# Patient Record
Sex: Male | Born: 1950
Health system: Southern US, Community
[De-identification: ages and names within clinical notes are randomized; demographics above are authoritative.]

## PROBLEM LIST (undated history)

## (undated) DIAGNOSIS — M5412 Radiculopathy, cervical region: Secondary | ICD-10-CM

## (undated) DIAGNOSIS — I10 Essential (primary) hypertension: Secondary | ICD-10-CM

## (undated) DIAGNOSIS — R0789 Other chest pain: Secondary | ICD-10-CM

## (undated) DIAGNOSIS — R42 Dizziness and giddiness: Secondary | ICD-10-CM

## (undated) DIAGNOSIS — Z981 Arthrodesis status: Secondary | ICD-10-CM

## (undated) DIAGNOSIS — E785 Hyperlipidemia, unspecified: Secondary | ICD-10-CM

## (undated) DIAGNOSIS — E039 Hypothyroidism, unspecified: Secondary | ICD-10-CM

## (undated) DIAGNOSIS — Z87438 Personal history of other diseases of male genital organs: Secondary | ICD-10-CM

## (undated) DIAGNOSIS — G459 Transient cerebral ischemic attack, unspecified: Secondary | ICD-10-CM

## (undated) DIAGNOSIS — N529 Male erectile dysfunction, unspecified: Secondary | ICD-10-CM

## (undated) DIAGNOSIS — R002 Palpitations: Secondary | ICD-10-CM

## (undated) DIAGNOSIS — R319 Hematuria, unspecified: Secondary | ICD-10-CM

## (undated) DIAGNOSIS — M1990 Unspecified osteoarthritis, unspecified site: Secondary | ICD-10-CM

## (undated) HISTORY — DX: Transient cerebral ischemic attack, unspecified: G45.9

## (undated) HISTORY — DX: Male erectile dysfunction, unspecified: N52.9

## (undated) HISTORY — DX: Other chest pain: R07.89

## (undated) HISTORY — DX: Radiculopathy, cervical region: M54.12

## (undated) HISTORY — PX: CYSTOSCOPY W/ URETEROSCOPY: SUR379

## (undated) HISTORY — DX: Dizziness and giddiness: R42

## (undated) HISTORY — DX: Palpitations: R00.2

## (undated) HISTORY — DX: Hyperlipidemia, unspecified: E78.5

---

## 2000-07-29 ENCOUNTER — Observation Stay (HOSPITAL_COMMUNITY): Admission: RE | Admit: 2000-07-29 | Discharge: 2000-07-30 | Payer: Self-pay | Admitting: Neurosurgery

## 2000-07-29 HISTORY — PX: ANTERIOR CERVICAL DECOMP/DISCECTOMY FUSION: SHX1161

## 2000-08-17 ENCOUNTER — Encounter: Admission: RE | Admit: 2000-08-17 | Discharge: 2000-08-17 | Payer: Self-pay | Admitting: Neurosurgery

## 2000-10-19 ENCOUNTER — Encounter: Admission: RE | Admit: 2000-10-19 | Discharge: 2000-10-19 | Payer: Self-pay | Admitting: Neurosurgery

## 2001-01-14 ENCOUNTER — Encounter: Admission: RE | Admit: 2001-01-14 | Discharge: 2001-01-14 | Payer: Self-pay | Admitting: Neurosurgery

## 2001-09-12 ENCOUNTER — Ambulatory Visit (HOSPITAL_COMMUNITY): Admission: RE | Admit: 2001-09-12 | Discharge: 2001-09-12 | Payer: Self-pay | Admitting: Internal Medicine

## 2001-09-26 ENCOUNTER — Encounter: Payer: Self-pay | Admitting: Family Medicine

## 2001-09-26 ENCOUNTER — Ambulatory Visit (HOSPITAL_COMMUNITY): Admission: RE | Admit: 2001-09-26 | Discharge: 2001-09-26 | Payer: Self-pay | Admitting: Family Medicine

## 2002-09-26 ENCOUNTER — Encounter: Payer: Self-pay | Admitting: Family Medicine

## 2002-09-26 ENCOUNTER — Ambulatory Visit (HOSPITAL_COMMUNITY): Admission: RE | Admit: 2002-09-26 | Discharge: 2002-09-26 | Payer: Self-pay | Admitting: Family Medicine

## 2003-06-30 DIAGNOSIS — R0789 Other chest pain: Secondary | ICD-10-CM

## 2003-06-30 DIAGNOSIS — R42 Dizziness and giddiness: Secondary | ICD-10-CM

## 2003-06-30 HISTORY — DX: Other chest pain: R07.89

## 2003-06-30 HISTORY — DX: Dizziness and giddiness: R42

## 2003-08-09 ENCOUNTER — Ambulatory Visit (HOSPITAL_COMMUNITY): Admission: RE | Admit: 2003-08-09 | Discharge: 2003-08-09 | Payer: Self-pay | Admitting: Family Medicine

## 2003-09-28 ENCOUNTER — Ambulatory Visit (HOSPITAL_COMMUNITY): Admission: RE | Admit: 2003-09-28 | Discharge: 2003-09-28 | Payer: Self-pay | Admitting: Family Medicine

## 2003-12-12 ENCOUNTER — Emergency Department (HOSPITAL_COMMUNITY): Admission: EM | Admit: 2003-12-12 | Discharge: 2003-12-12 | Payer: Self-pay | Admitting: Family Medicine

## 2004-10-14 ENCOUNTER — Ambulatory Visit (HOSPITAL_COMMUNITY): Admission: RE | Admit: 2004-10-14 | Discharge: 2004-10-14 | Payer: Self-pay | Admitting: Family Medicine

## 2004-12-15 ENCOUNTER — Ambulatory Visit (HOSPITAL_COMMUNITY): Admission: RE | Admit: 2004-12-15 | Discharge: 2004-12-15 | Payer: Self-pay | Admitting: Internal Medicine

## 2004-12-15 ENCOUNTER — Ambulatory Visit: Payer: Self-pay | Admitting: Internal Medicine

## 2005-10-16 ENCOUNTER — Ambulatory Visit (HOSPITAL_COMMUNITY): Admission: RE | Admit: 2005-10-16 | Discharge: 2005-10-16 | Payer: Self-pay | Admitting: Family Medicine

## 2007-11-08 ENCOUNTER — Ambulatory Visit (HOSPITAL_COMMUNITY): Admission: RE | Admit: 2007-11-08 | Discharge: 2007-11-08 | Payer: Self-pay | Admitting: Family Medicine

## 2009-02-27 ENCOUNTER — Emergency Department (HOSPITAL_COMMUNITY): Admission: EM | Admit: 2009-02-27 | Discharge: 2009-02-27 | Payer: Self-pay | Admitting: Emergency Medicine

## 2010-11-14 NOTE — H&P (Signed)
[place]. [facility]  Patient:    Derrick [name], Derrick [name]                         MRN: [id number] Adm. Date:  [id number] Attending:  Tressie Stalker D                         History and Physical  CHIEF COMPLAINT:  Neck pain and right shoulder and arm pain.  HISTORY OF PRESENT ILLNESS:  The patient is a 60 year old white male who was having some trouble with his neck and interscapular pain for about a year. Things got significantly worse the beginning of January 2002 when he began having more pain in his neck radiating to his right shoulder.  He was worked up as an outpatient with a cervical MRI; it demonstrated a large herniated disk at C4-5 as well as significant spondylosis at C5-6 and C6-7.  He was kindly sent for my consultation. I discussed the various treatment options with him including surgery.  The patient weighed the risks, benefits and alternatives to surgery and decided to proceed with a three-level anterior cervical diskectomy, fusion and plating.  PAST MEDICAL HISTORY:  Positive for hypertension and a right retinal hemorrhage.  PREVIOUS SURGICAL HISTORY:  Right eye surgery.  MEDICATIONS PRIOR TO ADMISSION:  Bisoprolol one p.o. q.d., prednisone taper,, Lorcet p.r.n. for pain.  ALLERGIES:  He has no known drug allergies.  FAMILY MEDICAL HISTORY:  Both the patients parents died at approximately age 40.  His mother had a myocardial infarction, his father a cerebrovascular accident.  SOCIAL HISTORY:  The patient is married.  He has two children.  He lives in [place].  He denies tobacco, ethanol or drug use.  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  REVIEW OF SYSTEMS:  Negative except as above.  PHYSICAL EXAMINATION  GENERAL:  A pleasant, well-nourished, well-developed 60 year old white male complaining of neck pain and right shoulder and arm pain.  Height 5 feet 10 inches.  Weight 198 pounds.  HEENT:  Normocephalic, atraumatic.   Pupils are equal, round and reactive to light.  Extraocular muscles are intact.  Sclerae white.  Conjunctivae pink. Oropharynx benign.  Uvula midline.  NECK:  Supple.  There are no masses, deformities, tracheal deviation, jugular venous distention or carotid bruits.  He has a limited cervical range of motion, particularly with rotation to the right.  Spurlings test is positive on the right and negative on the left.  Lhermittes sign was not present.  CHEST:  Thorax is symmetric.  Lungs clear to auscultation.  HEART:  Regular rate and rhythm.  ABDOMEN:  Soft and nontender.  EXTREMITIES:  No deformities.  BACK:  Exam is normal.  NEUROLOGIC:  The patient is alert and oriented x 3.  Cranial nerves II-XII are grossly intact bilaterally.  Vision and hearing are grossly normal bilaterally.  Motor strength is 5/5 in bilateral deltoids, biceps, triceps, hand grips, wrist extensors, interossei, psoas, quadriceps, gastrocnemii, extensor halluces longi.  Cerebellar exam is intact to rapid alternating movements of the upper extremities bilaterally.  Sensory exam is normal to light touch and pinprick sensation in all tested dermatomes bilaterally.  Deep tendon reflexes are 1/4 in his bilateral biceps and triceps, trace in his bilateral brachioradialis, 1-2/4 in his bilateral quadriceps and gastrocnemii. He has bilateral flexor plantar reflexes.  He has four-beat nonsustained ankle clonus on the left; no ankle clonus on the right.  IMAGING STUDIES:  Patient had a cervical MRI performed at [facility] on July 09, 2000 which demonstrates a normal cervical lordosis.  He has a large herniated disk at C4-5 on the right and small disk herniations/spondylosis at C5-6 and C6-7 with neural foraminal stenosis.  ASSESSMENT AND PLAN:  C4-5 herniated nucleus pulposus, C5-6 and C6-7 herniated nucleus pulposus, degenerative disease, spondylosis and spinal stenosis.  I discussed the situation with  the patient and reviewed his MRI scan with him and his wife and pointed out the abnormalities.  Clinically, his symptoms are most consistent with a right C5 or possibly C6 radiculopathy; however, he had significant neural compression at three levels, i.e., C4-5, C5-6 and C6-7.  I have discussed the various treatment options with him including doing nothing, continuing medical management and surgery.  I have discussed both anterior and posterior surgery.  I have described C4-5, C5-6 and C6-7 anterior cervical diskectomies and interbody iliac crest allograft arthrodesis with anterior cervical plating.  I have shown him surgical models and discussed the risks of surgery extensively.  The patient has weighed the risks, benefits and alternatives to surgery and has decided to proceed with the operation on July 29, 2000. DD:  07/29/00 TD:  07/30/00 Job: [id number]

## 2010-11-14 NOTE — Op Note (Signed)
NAMEJACOBI, Derrick [name]                  ACCOUNT NO.:  [id number]   MEDICAL RECORD NO.:  [id number]          PATIENT TYPE:  AMB   LOCATION:  DAY                           FACILITY:  [facility]   PHYSICIAN:  Lionel December, M.D.    DATE OF BIRTH:  01/04/1951   DATE OF PROCEDURE:  12/15/2004  DATE OF DISCHARGE:                                 OPERATIVE REPORT   PROCEDURE:  Colonoscopy.   INDICATIONS:  Derrick [name] is a 60 year old Caucasian male who had a colonoscopy by  Dr. Lovell Sheehan in [phone] with removal of one polyp, but this apparently was  loss because biopsy report indicated there was vegetable material. I did  look at the pictures from the procedure, and it showed what looked like a  polyp. He is here for surveillance exam. Family history is negative for  colorectal carcinoma.   Procedure risks were reviewed with the patient, and informed consent was  obtained.   PREMEDICATION:  Demerol 50 mg IV, Versed 4 mg IV in divided dose.   FINDINGS:  Procedure performed in endoscopy suite. The patient's vital signs  and O2 saturation were monitored during the procedure and remained stable.  The patient was placed in left lateral position and rectal examination  performed. No abnormality noted on external or digital exam. Olympus  videoscope was placed in the rectum and advanced under vision into sigmoid  colon beyond. Preparation was satisfactory. Scope was passed to the cecum  which was identified by appendiceal orifice and ileocecal valve. Pictures  taken for the record. As the scope was withdrawn, colonic mucosa was  carefully examined. A single polyp at hepatic flexure which was easily  ablated via cold biopsy. Mucosa of the rest of the colon was normal. Rectal  mucosa similarly was normal. Scope was retroflexed to examine anorectal  junction, and hemorrhoids were noted below and above the dentate line.  Endoscope was straightened and withdrawn. The patient tolerated the  procedure well.   FINAL  DIAGNOSES:  1.  Single small polyp at hepatic flexure which was ablated via cold biopsy.  2.  Internal/external hemorrhoids.   RECOMMENDATIONS:  1.  He will resume his usual medications.  2.  High-fiber diet.  3.  Contact the patient with biopsy results. Given today's findings, he can      wait five years until his next colonoscopy.       NR/MEDQ  D:  12/15/2004  T:  12/15/2004  Job:  [id number]   cc:   Kirk Ruths, M.D.  [address]  [place]  [place] [address]  Fax: [phone]

## 2010-11-14 NOTE — Discharge Summary (Signed)
[place]. [facility]  Patient:    Derrick [name], Derrick [name]                         MRN: [id number] Adm. Date:  [id number] Disc. Date: [id number] Attending:  Tressie Stalker D                           Discharge Summary  HISTORY OF PRESENT ILLNESS:  For the full details of this admission, please refer to the typed history and physical.  The patient is a 60 year old white male who has suffered from neck and right arm pain.  He failed medical management and was worked out as an outpatient with cervical MRI which demonstrated herniated disk at C4-5, C5-6 and C6-7. The patient therefore weighed the risks, benefits, alternatives of surgery to proceed with the operation.  For past medical history, past surgical history and medications prior to admission, drug allergies, family medical history, social history, admission physical exam, imaging studies, assessment, etc., please refer to typed history and physical.  HOSPITAL COURSE:  I performed a C4-5, C5-6 and C6-7 extensive anterior cervical diskectomy with allograft arthrodesis and anterior subplating (Codmans) on July 29, 2000, without complications.  (For full details of this of this operation, please refer to type operative note).  Postoperative course:  The patients postoperative course was unremarkable. By postoperative day #1, he was eating well, ambulating well.  His wound was healing well without signs of infection and he had normal motor strength in all four extremities.  He was requesting discharge to home.  The patient did have an episode of urinary retention and required in and out catheterization but this resolved by July 30, 2000.  The patient was therefore discharged to home on July 30, 2000.  DISCHARGE MEDICATIONS: 1. Resume his outpatient medical regimen. 2. I gave him a prescription for Valium 5 mg #40 one p.o. q.6h. p.r.n. for    muscle spasms. 3. Tylox #60 one or two p.o. q.4h. p.r.n. for  pain, limit of eight per day,    no refills.  DISCHARGE INSTRUCTIONS:  The patient was given written discharge instructions and instructed to follow up with me in four weeks.  DISCHARGE DIAGNOSES: 1. C4-5, C5-6 and C6-7 herniated nucleus pulposus, spondylosis,    degenerative disease, spinal stenosis, cervicalgia and cervical    radiculopathy. 2. Urinary retention.  PROCEDURE PERFORMED:  C4-5, C5-6 and C6-7 extensive anterior cervical diskectomy, interbody iliac crest allograft arthrodesis, anterior cervical plating C4-C7 with Codman plate. DD:  07/30/00 TD:  08/01/00 Job: [id number]

## 2010-11-14 NOTE — Op Note (Signed)
[place]. [facility]  Patient:    Derrick [name], Derrick [name]                         MRN: [id number] Proc. Date: 07/29/00 Adm. Date:  [id number] Attending:  Tressie Stalker D                           Operative Report  PREOPERATIVE DIAGNOSES:  C4-5 herniated nucleus pulposus, C5-6, C6-7, degenerative spondylosis, spinal stenosis, cervicalgia, cervical radiculopathy.  POSTOPERATIVE DIAGNOSES:  C4-5 herniated nucleus pulposus, C5-6, C6-7, degenerative spondylosis, spinal stenosis, cervicalgia, cervical radiculopathy.  PROCEDURES:  C4-5, C5-6, C6-7 extensive anterior cervical diskectomy, interbody iliac crest allograft arthrodesis, anterior cervical plating, C4 to C7, using Codman titanium cervical plate and screws.  SURGEON:  Cristi Loron, M.D.  ASSISTANT:  Tanya Nones. Jeral Fruit, M.D.  ANESTHESIA:  General endotracheal.  ESTIMATED BLOOD LOSS:  250 cc.  SPECIMENS:  None.  DRAINS:  None.  COMPLICATIONS:  None.  BRIEF HISTORY:  The patient is a 60 year old white male who has suffered from right shoulder and arm pain.  He failed medical management as an outpatient and was worked up with a cervical MRI that demonstrated a herniated disk at C4-5, C5-6, and C6-7.  The patient therefore weighed the risks, benefits, and alternatives of surgery and decided to proceed with anterior cervical diskectomy and fusion and fusion and plating.  DESCRIPTION OF PROCEDURE:  The patient was brought to the operating room by the anesthesia team.  General endotracheal anesthesia was induced.  The patient remained in the supine position.  A roll was placed under his shoulders to place his neck in slight extension.  The anterior cervical region was then prepared with Betadine scrub and Betadine solution, sterile drapes were applied, and I injected the area to be incised with Marcaine with epinephrine solution.  I used a scalpel to make an incision in the patients left neck along the  anterior border of the sternocleidomastoid muscle.  I used the Metzenbaum scissors to dissect out to the platysma muscle and divided it along the direction of the skin incision.  I dissected medial to the sternocleidomastoid muscle, jugular vein, carotid artery, and then bluntly dissected down to the anterior cervical spine.  I therefore identified the esophagus and retracted it medially.  I cleared the soft tissue from the anterior cervical spine with the Kitner swabs and then inserted a bent spinal needle into the upper exposed interspace.  I obtained the intraoperative radiograph that demonstrated that the needle was at C3-4.  I then turned my attention to the lower disk space.  I used the electrocautery to detach the medial border of the longus colli muscle from off the C4-5, C5-6, and C6-7 intervertebral disk space.  I inserted the Caspar self-retaining retractor for exposure, and I began a decompression at C4-5.  I brought the operative microscope into the field.  Under some magnification and illumination, I completed the decompression.  I used the 15 blade scalpel to incise the C5-6 intervertebral disk.  I performed a partial diskectomy with the pituitary forceps and Carlin curettes and inserted distraction screws at C4, C5, and distracted the interspace.  I drilled away the remainder of the intervertebral disk and thinned out the posterior longitudinal ligament.  There was a large herniated disk just off to the right.  I removed it in one large fragment using a pituitary forceps.  I removed the  remainder of the posterior longitudinal ligament with the Kerrison punch, undercutting the vertebral end plates at X9-1, decompressing the thecal sac, and then performed foraminotomies about the bilateral C5 nerve roots.  I then repeated the procedure at C5-6, i.e., I removed the distraction screw from C4, placing it into C6, distracting the interspace, incising the disk.  I performed a  partial diskectomy with the pituitary forceps and Carlin curettes, drilled away the remainder of the intervertebral disk at C5-6, decorticating the vertebral end plates, thinning out the posterior longitudinal ligament.  There were large bone spurs at this level.  I drilled them away with the drill.  I removed the remainder of the posterior longitudinal ligament with the Kerrison punch, undercutting the vertebral end plates at Y7-8, decompressing the thecal sac. I then performed foraminotomies about the bilateral C6 nerve roots, decompressing the nerve roots.  I then repeated this procedure at C6-7, removing the distraction screw from C5, placing it into C7, incising the intervertebral disk, performing a partial diskectomy with the pituitary forceps and the Carlin curettes.  I drilled away the remainder of the intervertebral disk with the high-speed drill, decorticating the vertebral end plates at G9-5, thinned out the posterior longitudinal ligament.  I drilled away some bone spurs from the vertebral end plates posteriorly, and then I removed the remainder of the posterior longitudinal ligament with the Kerrison punch, undercutting the vertebral end plates at C6 and C7.  I performed a foraminotomy of the bilateral C7 nerve roots, decompressing the nerve roots. At this point, I had a good decompression of the C4-5, C5-6, and C6-7 thecal sac, and the bilateral C5, C6, and C7 nerve roots.  I now turned my attention to the arthrodesis, obtained iliac crest tricortical allograft bone graft.  I fashioned it to these approximate dimensions:  7 mm in height and 1 cm in depth.  I placed one bone graft into the distracted C6-7 interspace, removed the distraction screw from C7, placed it in C5, distracted the C5-6 interspace, put another bone graft into C5-6 interspace, and then removed the distraction screw from C6 and placed it into C4, distracting C4-5, placed another allograft into C4-5  distracted interspace.  I then removed the distraction screws.  There was a good, snug fit of the bone graft at each level.  Having completed the arthrodesis, I now turned my attention to anterior spinal  instrumentation.  I used the high-speed drill to remove some anterior bone spurs from the anterior aspect of the C4, C5, C6, and C7 vertebral bodies.  I obtained the appropriate length Codman anterior cervical plate, laid it along the anterior aspect of the vertebral bodies from C4 to C7.  I put two holes at C4, two at C5, two at C6, and two at C7, tapped these holes, and secured the plate to the vertebral bodies with two 15 mm screws at each vertebra, and then obtained the intraoperative radiograph.  It demonstrated good position of the plate and screws (although I did not see the bottom screws because of the patients large shoulders, but they looked good in vivo).  I then secured these screws to the plate using the cam tightener at each screw.  I then copiously irrigated the wound with bacitracin solution, removed the solution. I achieved stringent hemostasis with bipolar electrocautery and Gelfoam and then removed the McCullough retractor and inspected the patients esophagus for any damage.  There was none.  I then reapproximated the patients platysma muscle with interrupted 3-0 Vicryl  suture, the subcutaneous tissue with interrupted 3-0 Vicryl suture, and the skin with Steri-Strips and benzoin. The wound was then coated with bacitracin ointment, a sterile dressing applied, and the drapes were removed.  The patient was subsequently extubated by the anesthesia team and transported to the postanesthesia care unit in stable condition.  All sponge, instrument, and needle counts were correct at the end of this case.  interrupted 3-0 Vicryl suture in the DD:  07/29/00 TD:  07/30/00 Job: [id number]

## 2011-09-24 ENCOUNTER — Other Ambulatory Visit (HOSPITAL_COMMUNITY): Payer: Self-pay | Admitting: Endocrinology

## 2011-09-24 DIAGNOSIS — N63 Unspecified lump in unspecified breast: Secondary | ICD-10-CM

## 2011-09-29 ENCOUNTER — Ambulatory Visit
Admission: RE | Admit: 2011-09-29 | Discharge: 2011-09-29 | Disposition: A | Discharge status: Home / Self Care | Payer: 59 | Source: Ambulatory Visit | Attending: Endocrinology | Admitting: Endocrinology

## 2011-09-29 DIAGNOSIS — N63 Unspecified lump in unspecified breast: Secondary | ICD-10-CM

## 2011-12-01 ENCOUNTER — Other Ambulatory Visit: Payer: Self-pay | Admitting: Urology

## 2011-12-18 ENCOUNTER — Encounter (HOSPITAL_BASED_OUTPATIENT_CLINIC_OR_DEPARTMENT_OTHER): Payer: Self-pay | Admitting: *Deleted

## 2011-12-18 NOTE — Progress Notes (Signed)
NPO AFTER MN. ARRIVES AT 1015. NEEDS ISTAT. CURRENT NOTE, EKG, STRESS TEST TO BE FAXED FROM [facility] [phone].  WILL TAKE SYNTHROID AM OF SURG W/ SIP OF WATER.

## 2011-12-24 NOTE — H&P (Signed)
Urology History and Physical Exam  CC: Blood in urine  HPI: 60 year old male presents for anesthetic cystoscopy, bilateral retrogrades for further evaluation of a thickened bladder and mild dilatation of his distal ureters seen on hematuria CT.   His urologic history is as follows:   ELEVATED PSA   He has had a prostate biopsy performed in November 2007. At that time, glandular size was about 40 cc and PSA was 3.45 . He had one focus of atypical acinar cells on the right side. His PSA since that time has been stable. His last PSA levels in December 2010 and May 2011, were both the same at 4.07. His PSA in September 2011 was 5.06. On 09/11/2010 it was 4.76. His PSA was checked again in June, 2012 was down to 2.86.  He was having decreased sensation with ejaculation. That has improved.  INTERVAL HISTORY: Derrick [name] was seen about a month ago by Dr. Regino Schultze for bilateral lower back pain, moving more to the left side recently. This has had some anterior radiation. It is "sore" and is not associated with gross hematuria. He was found to have microscopic hematuria, however and sent to [place] for further evaluation. He denies any change in lower urinary tract pattern. He has had no dysuria. He has had no nausea or vomiting. He denies any prior history of kidney stones   PMH: Past Medical History  Diagnosis Date  . Hematuria   . Arthritis NECK  . S/P cervical spinal fusion   . Hypertension CARDIOLOGIST- [facility]    STRESS TEST YRS AGO-- OK  ; LAST VISIT 1 MONTH AGO  . Hypothyroidism   . History of prostatitis     PSH: Past Surgical History  Procedure Date  . Anterior cervical decomp/discectomy fusion 07-29-2000    C4 - C7  . Cystoscopy w/ ureteroscopy 20 YRS AGO  (APPROX. 1990'S)    Allergies: No Known Allergies  Medications: No prescriptions prior to admission     Social History: History   Social History  . Marital Status: Married    Spouse Name: N/A    Number of  Children: N/A  . Years of Education: N/A   Occupational History  . Not on file.   Social History Main Topics  . Smoking status: Never Smoker   . Smokeless tobacco: Never Used  . Alcohol Use: No  . Drug Use: No  . Sexually Active:    Other Topics Concern  . Not on file   Social History Narrative  . No narrative on file    Family History: History reviewed. No pertinent family history.  Review of Systems: Positive: Back pain Negative:   A further 10 point review of systems was negative except what is listed in the HPI.  Physical Exam: @VITALS2 @ General: No acute distress.  Awake. Head:  Normocephalic.  Atraumatic. ENT:  EOMI.  Mucous membranes moist Neck:  Supple.  No lymphadenopathy. CV:  S1 present. S2 present. Regular rate. Pulmonary: Equal effort bilaterally.  Clear to auscultation bilaterally. Abdomen: Soft.  Non tender to palpation. Skin:  Normal turgor.  No visible rash. Extremity: No gross deformity of bilateral upper extremities.  No gross deformity of    bilateral lower extremities. Neurologic: Alert. Appropriate mood.    Studies:  No results found for this basename: HGB:2,WBC:2,PLT:2 in the last 72 hours  No results found for this basename: NA:2,K:2,CL:2,CO2:2,BUN:2,CREATININE:2,CALCIUM:2,MAGNESIUM:2,GFRNONAA:2,GFRAA:2 in the last 72 hours   No results found for this basename: PT:2,INR:2,APTT:2 in the last 72  hours   No components found with this basename: ABG:2    Assessment:  Microscopic hematuria   Plan: Anesthetic cystoscopy, bilateral retrograde pyelograms, possible ureteroscopy

## 2011-12-25 ENCOUNTER — Encounter (HOSPITAL_BASED_OUTPATIENT_CLINIC_OR_DEPARTMENT_OTHER)
Admission: RE | Disposition: A | Discharge status: Home / Self Care | Payer: Self-pay | Source: Ambulatory Visit | Attending: Urology

## 2011-12-25 ENCOUNTER — Ambulatory Visit (HOSPITAL_BASED_OUTPATIENT_CLINIC_OR_DEPARTMENT_OTHER)
Admission: RE | Admit: 2011-12-25 | Discharge: 2011-12-25 | Disposition: A | Discharge status: Home / Self Care | Payer: 59 | Source: Ambulatory Visit | Attending: Urology | Admitting: Urology

## 2011-12-25 ENCOUNTER — Encounter (HOSPITAL_BASED_OUTPATIENT_CLINIC_OR_DEPARTMENT_OTHER): Payer: Self-pay | Admitting: Anesthesiology

## 2011-12-25 ENCOUNTER — Encounter (HOSPITAL_BASED_OUTPATIENT_CLINIC_OR_DEPARTMENT_OTHER): Payer: Self-pay | Admitting: *Deleted

## 2011-12-25 ENCOUNTER — Ambulatory Visit (HOSPITAL_BASED_OUTPATIENT_CLINIC_OR_DEPARTMENT_OTHER): Payer: 59 | Admitting: Anesthesiology

## 2011-12-25 DIAGNOSIS — R3129 Other microscopic hematuria: Secondary | ICD-10-CM | POA: Insufficient documentation

## 2011-12-25 DIAGNOSIS — N2889 Other specified disorders of kidney and ureter: Secondary | ICD-10-CM | POA: Insufficient documentation

## 2011-12-25 DIAGNOSIS — I10 Essential (primary) hypertension: Secondary | ICD-10-CM | POA: Insufficient documentation

## 2011-12-25 DIAGNOSIS — E039 Hypothyroidism, unspecified: Secondary | ICD-10-CM | POA: Insufficient documentation

## 2011-12-25 HISTORY — DX: Arthrodesis status: Z98.1

## 2011-12-25 HISTORY — DX: Personal history of other diseases of male genital organs: Z87.438

## 2011-12-25 HISTORY — DX: Hematuria, unspecified: R31.9

## 2011-12-25 HISTORY — DX: Essential (primary) hypertension: I10

## 2011-12-25 HISTORY — PX: CYSTOSCOPY W/ RETROGRADES: SHX1426

## 2011-12-25 HISTORY — DX: Unspecified osteoarthritis, unspecified site: M19.90

## 2011-12-25 HISTORY — DX: Hypothyroidism, unspecified: E03.9

## 2011-12-25 LAB — POCT I-STAT 4, (NA,K, GLUC, HGB,HCT)
Hemoglobin: 15.3 g/dL (ref 13.0–17.0)
Sodium: 144 mEq/L (ref 135–145)

## 2011-12-25 SURGERY — CYSTOSCOPY, WITH RETROGRADE PYELOGRAM
Anesthesia: General | Site: Ureter | Laterality: Bilateral | Wound class: Clean Contaminated

## 2011-12-25 MED ORDER — IOHEXOL 350 MG/ML SOLN
INTRAVENOUS | Status: DC | PRN
  Administered 2011-12-25: 50 mL

## 2011-12-25 MED ORDER — ONDANSETRON HCL 4 MG/2ML IJ SOLN: 4.0000 mg | Freq: Four times a day (QID) | INTRAMUSCULAR | Status: DC | PRN

## 2011-12-25 MED ORDER — ACETAMINOPHEN 325 MG PO TABS: 650.0000 mg | ORAL_TABLET | ORAL | Status: DC | PRN

## 2011-12-25 MED ORDER — PROPOFOL 10 MG/ML IV EMUL
INTRAVENOUS | Status: DC | PRN
  Administered 2011-12-25: 200 mg via INTRAVENOUS

## 2011-12-25 MED ORDER — LACTATED RINGERS IV SOLN
INTRAVENOUS | Status: DC
  Administered 2011-12-25: 100 mL/h via INTRAVENOUS

## 2011-12-25 MED ORDER — MIDAZOLAM HCL 5 MG/5ML IJ SOLN
INTRAMUSCULAR | Status: DC | PRN
  Administered 2011-12-25: 1 mg via INTRAVENOUS

## 2011-12-25 MED ORDER — KETOROLAC TROMETHAMINE 30 MG/ML IJ SOLN
INTRAMUSCULAR | Status: DC | PRN
  Administered 2011-12-25: 30 mg via INTRAVENOUS

## 2011-12-25 MED ORDER — HYDROCODONE-ACETAMINOPHEN 5-500 MG PO CAPS: 1.0000 | ORAL_CAPSULE | ORAL | Status: AC | PRN

## 2011-12-25 MED ORDER — PROMETHAZINE HCL 25 MG/ML IJ SOLN: 6.2500 mg | INTRAMUSCULAR | Status: DC | PRN

## 2011-12-25 MED ORDER — BELLADONNA ALKALOIDS-OPIUM 16.2-60 MG RE SUPP
RECTAL | Status: DC | PRN
  Administered 2011-12-25: 1 via RECTAL

## 2011-12-25 MED ORDER — CEFAZOLIN SODIUM 1-5 GM-% IV SOLN: 1.0000 g | INTRAVENOUS | Status: DC

## 2011-12-25 MED ORDER — ACETAMINOPHEN 650 MG RE SUPP: 650.0000 mg | RECTAL | Status: DC | PRN

## 2011-12-25 MED ORDER — FENTANYL CITRATE 0.05 MG/ML IJ SOLN
INTRAMUSCULAR | Status: DC | PRN
  Administered 2011-12-25: 50 ug via INTRAVENOUS

## 2011-12-25 MED ORDER — SODIUM CHLORIDE 0.9 % IJ SOLN: 3.0000 mL | Freq: Two times a day (BID) | INTRAMUSCULAR | Status: DC

## 2011-12-25 MED ORDER — DEXAMETHASONE SODIUM PHOSPHATE 4 MG/ML IJ SOLN
INTRAMUSCULAR | Status: DC | PRN
  Administered 2011-12-25: 10 mg via INTRAVENOUS

## 2011-12-25 MED ORDER — CEFAZOLIN SODIUM-DEXTROSE 2-3 GM-% IV SOLR
2.0000 g | INTRAVENOUS | Status: AC
  Administered 2011-12-25: 2 g via INTRAVENOUS

## 2011-12-25 MED ORDER — OXYCODONE HCL 5 MG PO TABS: 5.0000 mg | ORAL_TABLET | ORAL | Status: DC | PRN

## 2011-12-25 MED ORDER — FENTANYL CITRATE 0.05 MG/ML IJ SOLN: 25.0000 ug | INTRAMUSCULAR | Status: DC | PRN

## 2011-12-25 MED ORDER — SODIUM CHLORIDE 0.9 % IV SOLN: 250.0000 mL | INTRAVENOUS | Status: DC | PRN

## 2011-12-25 MED ORDER — SULFAMETHOXAZOLE-TRIMETHOPRIM 800-160 MG PO TABS: 1.0000 | ORAL_TABLET | Freq: Two times a day (BID) | ORAL | Status: AC

## 2011-12-25 MED ORDER — MORPHINE SULFATE 2 MG/ML IJ SOLN: 2.0000 mg | INTRAMUSCULAR | Status: DC | PRN

## 2011-12-25 MED ORDER — KETOROLAC TROMETHAMINE 30 MG/ML IJ SOLN: 15.0000 mg | Freq: Once | INTRAMUSCULAR | Status: DC | PRN

## 2011-12-25 MED ORDER — SODIUM CHLORIDE 0.9 % IJ SOLN: 3.0000 mL | INTRAMUSCULAR | Status: DC | PRN

## 2011-12-25 MED ORDER — ONDANSETRON HCL 4 MG/2ML IJ SOLN
INTRAMUSCULAR | Status: DC | PRN
  Administered 2011-12-25: 4 mg via INTRAVENOUS

## 2011-12-25 MED ORDER — LIDOCAINE HCL (CARDIAC) 20 MG/ML IV SOLN
INTRAVENOUS | Status: DC | PRN
  Administered 2011-12-25: 60 mg via INTRAVENOUS

## 2011-12-25 SURGICAL SUPPLY — 18 items
ADAPTER CATH URET PLST 4-6FR (CATHETERS) ×2 IMPLANT
ADPR CATH URET STRL DISP 4-6FR (CATHETERS) ×2
BAG DRAIN URO-CYSTO SKYTR STRL (DRAIN) ×3 IMPLANT
BAG DRN UROCATH (DRAIN) ×2
CANISTER SUCT LVC 12 LTR MEDI- (MISCELLANEOUS) ×2 IMPLANT
CATH INTERMIT  6FR 70CM (CATHETERS) ×2 IMPLANT
CLOTH BEACON ORANGE TIMEOUT ST (SAFETY) ×3 IMPLANT
DRAPE CAMERA CLOSED 9X96 (DRAPES) ×3 IMPLANT
GLOVE BIO SURGEON STRL SZ8 (GLOVE) ×3 IMPLANT
GLOVE INDICATOR 6.5 STRL GRN (GLOVE) ×2 IMPLANT
GOWN STRL REIN XL XLG (GOWN DISPOSABLE) ×3 IMPLANT
GOWN XL W/COTTON TOWEL STD (GOWNS) ×3 IMPLANT
GUIDEWIRE 0.038 PTFE COATED (WIRE) IMPLANT
GUIDEWIRE ANG ZIPWIRE 038X150 (WIRE) IMPLANT
GUIDEWIRE STR DUAL SENSOR (WIRE) IMPLANT
IV NS IRRIG 3000ML ARTHROMATIC (IV SOLUTION) ×4 IMPLANT
NS IRRIG 500ML POUR BTL (IV SOLUTION) IMPLANT
PACK CYSTOSCOPY (CUSTOM PROCEDURE TRAY) ×3 IMPLANT

## 2011-12-25 NOTE — Op Note (Signed)
Preoperative diagnosis: Hematuria with bilateral ureteral dilatation Postoperative diagnosis: Same  Procedure: Cystoscopy, bilateral retrograde ureteropyelograms, interpretive fluoroscopy   Surgeon: Bertram Millard. Yannely Kintzel, M.D.  Anesthesia: Gen.  Indications: 61 year old male who was seen recently for microscopic hematuria and flank pain. CT revealed no evident ureteral stones, no nephrolithiasis, but the patient did have bilateral lower ureteral dilatation and a thickwalled bladder. He presents at this time for cystoscopy and retrograde pyelograms bilaterally, with possible ureteroscopy. Risks and complications have been discussed with the patient. These include but are not limited to worsened hematuria, infection a ureteral obstruction/injury. He understands these and desires to proceed.     Technique and findings: The patient was properly identified in the holding area and received preoperative IV antibiotics previous taken the operating room where general anesthetic was administered with the LMA. He is placed in the dorsolithotomy position. Genitalia and perineum were prepped and draped. Proper timeout was then performed.  A 22 French panendoscope was then passed under direct vision through his urethra. Urethra was free of lesions, prostate was nonobstructive. Bladder was entered and inspected circumferentially. There were 1-2+ trabeculations scattered throughout the bladder. There were no other mucosal abnormalities. There were no stones or foreign bodies. Ureteral orifices were normal in configuration and location. Still photographs of the bladder were taken.  At this point, bilateral retrograde ureteropyelograms were performed. I used a sensor-tip guidewire to assist with navigation of the ureteral orifice his, which was somewhat difficult to cannulate.  Both ureters appeared normal except for dilatation of the distal ureter, within the pelvis. There was no evidence of filling defect on either  side. More proximal and mid ureters were normal. There were some air bubbles on the right side which were introduced when passing the guidewire. However, no filling defects were seen within either pyelo-calyceal system, which appeared to be normal. There was adequate drainage with delayed views.  At this point, the bladder was drained and the scope removed.  The patient was awakened and taken to the PACU in stable condition.

## 2011-12-25 NOTE — Transfer of Care (Signed)
Immediate Anesthesia Transfer of Care Note  Patient: Derrick [name]  Procedure(s) Performed: Procedure(s) (LRB): CYSTOSCOPY WITH RETROGRADE PYELOGRAM (Bilateral)  Patient Location: PACU  Anesthesia Type: General  Level of Consciousness: sedated and responds to stimulation  Airway & Oxygen Therapy: Patient Spontanous Breathing and Patient connected to nasal cannula oxygen  Post-op Assessment: Report given to PACU RN  Post vital signs: Reviewed and stable  Complications: No apparent anesthesia complications

## 2011-12-25 NOTE — Discharge Instructions (Signed)
1. You may see some blood in the urine and may have some burning with urination for 48-72 hours. You also may notice that you have to urinate more frequently or urgently after your procedure which is normal.  2. You should call should you develop an inability urinate, fever > 101, persistent nausea and vomiting that prevents you from eating or drinking to stay hydrated.  Medications have been sent to your pharmacy --antibiotic twice daily for 3 days and pain medicine as needed Post Anesthesia Home Care Instructions  Activity: Get plenty of rest for the remainder of the day. A responsible adult should stay with you for 24 hours following the procedure.  For the next 24 hours, DO NOT: -Drive a car -Advertising copywriter -Drink alcoholic beverages -Take any medication unless instructed by your physician -Make any legal decisions or sign important papers.  Meals: Start with liquid foods such as gelatin or soup. Progress to regular foods as tolerated. Avoid greasy, spicy, heavy foods. If nausea and/or vomiting occur, drink only clear liquids until the nausea and/or vomiting subsides. Call your physician if vomiting continues.  Special Instructions/Symptoms: Your throat may feel dry or sore from the anesthesia or the breathing tube placed in your throat during surgery. If this causes discomfort, gargle with warm salt water. The discomfort should disappear within 24 hours. 3.

## 2011-12-25 NOTE — Anesthesia Procedure Notes (Signed)
Procedure Name: LMA Insertion Date/Time: 12/25/2011 11:43 AM Performed by: Maris Berger T Pre-anesthesia Checklist: Patient identified, Emergency Drugs available, Suction available and Patient being monitored Patient Re-evaluated:Patient Re-evaluated prior to inductionOxygen Delivery Method: Circle System Utilized Preoxygenation: Pre-oxygenation with 100% oxygen Intubation Type: IV induction Ventilation: Mask ventilation without difficulty LMA: LMA inserted LMA Size: 5.0 Number of attempts: 1 Airway Equipment and Method: bite block Placement Confirmation: positive ETCO2 Dental Injury: Teeth and Oropharynx as per pre-operative assessment

## 2011-12-25 NOTE — Anesthesia Preprocedure Evaluation (Signed)
Anesthesia Evaluation  Patient identified by MRN, date of birth, ID band Patient awake    Reviewed: Allergy & Precautions, H&P , NPO status , Patient's Chart, lab work & pertinent test results  Airway Mallampati: II TM Distance: <3 FB Neck ROM: Limited    Dental No notable dental hx.    Pulmonary neg pulmonary ROS,  breath sounds clear to auscultation  Pulmonary exam normal       Cardiovascular hypertension, Rhythm:Regular Rate:Normal     Neuro/Psych negative neurological ROS  negative psych ROS   GI/Hepatic negative GI ROS, Neg liver ROS,   Endo/Other  Hypothyroidism   Renal/GU negative Renal ROS  negative genitourinary   Musculoskeletal negative musculoskeletal ROS (+)   Abdominal   Peds negative pediatric ROS (+)  Hematology negative hematology ROS (+)   Anesthesia Other Findings   Reproductive/Obstetrics negative OB ROS                           Anesthesia Physical Anesthesia Plan  ASA: II  Anesthesia Plan: General   Post-op Pain Management:    Induction: Intravenous  Airway Management Planned: LMA  Additional Equipment:   Intra-op Plan:   Post-operative Plan:   Informed Consent: I have reviewed the patients History and Physical, chart, labs and discussed the procedure including the risks, benefits and alternatives for the proposed anesthesia with the patient or authorized representative who has indicated his/her understanding and acceptance.   Dental advisory given  Plan Discussed with: CRNA  Anesthesia Plan Comments:         Anesthesia Quick Evaluation

## 2011-12-28 ENCOUNTER — Encounter (HOSPITAL_BASED_OUTPATIENT_CLINIC_OR_DEPARTMENT_OTHER): Payer: Self-pay | Admitting: Urology

## 2011-12-28 NOTE — Anesthesia Postprocedure Evaluation (Signed)
  Anesthesia Post-op Note  Patient: Derrick [name]  Procedure(s) Performed: Procedure(s) (LRB): CYSTOSCOPY WITH RETROGRADE PYELOGRAM (Bilateral)  Patient Location: PACU  Anesthesia Type: General  Level of Consciousness: awake and alert   Airway and Oxygen Therapy: Patient Spontanous Breathing  Post-op Pain: mild  Post-op Assessment: Post-op Vital signs reviewed, Patient's Cardiovascular Status Stable, Respiratory Function Stable, Patent Airway and No signs of Nausea or vomiting  Post-op Vital Signs: stable  Complications: No apparent anesthesia complications

## 2012-05-10 ENCOUNTER — Ambulatory Visit (INDEPENDENT_AMBULATORY_CARE_PROVIDER_SITE_OTHER): Payer: 59 | Admitting: Internal Medicine

## 2012-05-23 LAB — PACEMAKER DEVICE OBSERVATION

## 2012-05-24 ENCOUNTER — Other Ambulatory Visit (HOSPITAL_COMMUNITY): Payer: Self-pay | Admitting: Cardiology

## 2012-05-24 DIAGNOSIS — I472 Ventricular tachycardia: Secondary | ICD-10-CM

## 2012-05-25 ENCOUNTER — Ambulatory Visit (HOSPITAL_COMMUNITY)
Admission: RE | Admit: 2012-05-25 | Discharge: 2012-05-25 | Disposition: A | Discharge status: Home / Self Care | Payer: 59 | Source: Ambulatory Visit | Attending: Cardiology | Admitting: Cardiology

## 2012-05-25 DIAGNOSIS — I472 Ventricular tachycardia, unspecified: Secondary | ICD-10-CM | POA: Insufficient documentation

## 2012-05-25 DIAGNOSIS — I4729 Other ventricular tachycardia: Secondary | ICD-10-CM | POA: Insufficient documentation

## 2012-05-25 NOTE — Progress Notes (Signed)
[facility]   2D echo completed 05/25/2012.   Veda Canning, RDCS

## 2012-08-08 ENCOUNTER — Ambulatory Visit (INDEPENDENT_AMBULATORY_CARE_PROVIDER_SITE_OTHER): Payer: 59 | Admitting: Internal Medicine

## 2012-08-08 ENCOUNTER — Encounter (INDEPENDENT_AMBULATORY_CARE_PROVIDER_SITE_OTHER): Payer: Self-pay | Admitting: Internal Medicine

## 2012-08-08 VITALS — BP 140/88 | HR 78 | Temp 98.4°F | Resp 18 | Ht 70.0 in | Wt 200.5 lb

## 2012-08-08 DIAGNOSIS — R1012 Left upper quadrant pain: Secondary | ICD-10-CM

## 2012-08-08 DIAGNOSIS — E039 Hypothyroidism, unspecified: Secondary | ICD-10-CM | POA: Insufficient documentation

## 2012-08-08 DIAGNOSIS — I10 Essential (primary) hypertension: Secondary | ICD-10-CM | POA: Insufficient documentation

## 2012-08-08 NOTE — Patient Instructions (Signed)
Physician will contact you with results of MRI when completed
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
## 2012-08-18 ENCOUNTER — Encounter (INDEPENDENT_AMBULATORY_CARE_PROVIDER_SITE_OTHER): Payer: Self-pay

## 2012-11-20 ENCOUNTER — Encounter: Payer: Self-pay | Admitting: *Deleted

## 2012-11-24 ENCOUNTER — Encounter: Payer: Self-pay | Admitting: Cardiology

## 2012-11-28 ENCOUNTER — Ambulatory Visit (INDEPENDENT_AMBULATORY_CARE_PROVIDER_SITE_OTHER): Payer: 59 | Admitting: Cardiovascular Disease

## 2012-11-28 ENCOUNTER — Telehealth: Payer: Self-pay | Admitting: Cardiovascular Disease

## 2012-11-28 ENCOUNTER — Encounter: Payer: Self-pay | Admitting: Cardiovascular Disease

## 2012-11-28 VITALS — BP 162/99 | HR 66 | Resp 16 | Ht 70.0 in | Wt 204.0 lb

## 2012-11-28 DIAGNOSIS — R002 Palpitations: Secondary | ICD-10-CM

## 2012-11-28 DIAGNOSIS — I10 Essential (primary) hypertension: Secondary | ICD-10-CM

## 2012-11-28 DIAGNOSIS — E7800 Pure hypercholesterolemia, unspecified: Secondary | ICD-10-CM

## 2012-11-28 DIAGNOSIS — I472 Ventricular tachycardia: Secondary | ICD-10-CM

## 2012-11-28 MED ORDER — LISINOPRIL 5 MG PO TABS: 5.0000 mg | ORAL_TABLET | Freq: Every day | ORAL | Status: DC

## 2012-11-28 MED ORDER — LISINOPRIL 5 MG PO TABS: 5.0000 mg | ORAL_TABLET | Freq: Every morning | ORAL | Status: DC

## 2012-11-28 NOTE — Patient Instructions (Addendum)
Your physician has recommended you make the following change in your medication: take lisinopril 5 mg every morning. Continue bisoprolol/hydrochlorothiazide 5-6.25 mg daily in the evenings only.  Please check your blood pressure while sitting down and relaxed for at least 15-20 minutes and bring a log of her blood pressure to Follow up visit Your physician recommends that you schedule a follow-up appointment in: 6 weeks .

## 2012-11-28 NOTE — Telephone Encounter (Signed)
Yes, no contraindication. Do not take nitrates.

## 2012-11-28 NOTE — Progress Notes (Signed)
Patient ID: Derrick [name], male   DOB: 1951/02/05, 62 y.o.   MRN: [id number]  Elevated by Dr. Ezzard Standing, ENT, no abnormalities found. He recommended balance exercises.  Often feels disoriented and has balance problems if he moves his head quickly.  He tells me that his Ziac was she prescribed twice daily but he only takes in the evening because he feels woozy he takes a morning dose.  We'll start lisinopril 5 mg every morning, expect we'll have to increase the dose.     Reason for office visit Hypertension, balance problems  [name] has systemic hypertension hyperlipidemia and erectile dysfunction but no known structural heart disease by previous echocardiogram other than mild LVH. Recently his complaints of mostly been related to balance difficulties and tinnitus. He has been evaluated by Dr. Ezzard Standing who has recommended some balance exercises but no other specific treatment.  He says he often feels disoriented and has balance problems if he moves his head side to side too quickly. He has been taking his Ziac (previously prescribed as a twice daily medication) only in the evenings. If he takes it in the mornings he feels woozy all day long.    Allergies  Allergen Reactions  . Viagra (Sildenafil Citrate)     Causes a headache, also occurs with Cialis    Current Outpatient Prescriptions  Medication Sig Dispense Refill  . aspirin 81 MG tablet Take 81 mg by mouth daily.      . bisoprolol-hydrochlorothiazide (ZIAC) 5-6.25 MG per tablet Take 1 tablet by mouth every evening.       Marland Kitchen levothyroxine (SYNTHROID, LEVOTHROID) 25 MCG tablet Take 50 mcg by mouth every morning.       . sildenafil (VIAGRA) 100 MG tablet Take 100 mg by mouth daily as needed for erectile dysfunction.      . Flaxseed, Linseed, (FLAX SEED OIL PO) Take by mouth daily.      Marland Kitchen lisinopril (PRINIVIL,ZESTRIL) 5 MG tablet Take 1 tablet (5 mg total) by mouth daily.  90 tablet  3   No current facility-administered medications for  this visit.    Past Medical History  Diagnosis Date  . Hematuria   . Arthritis NECK  . S/P cervical spinal fusion   . Hypertension CARDIOLOGIST- [facility]    STRESS TEST YRS AGO-- OK  ; LAST VISIT 1 MONTH AGO  . Hypothyroidism   . History of prostatitis   . ED (erectile dysfunction)     intolerant to viagra and cialis  . Hyperlipidemia   . Palpitations     event monitor 03-2012-showed a single 6 beat run of NSVT; echo 05/25/12-EF55-60%, mild LVH  . Chest pain, atypical 2005    myoview 09/13/03-normal perfusion  . Vertigo 2005    nl carotid dopplers 08/09/2003    Past Surgical History  Procedure Laterality Date  . Anterior cervical decomp/discectomy fusion  07-29-2000    C4 - C7  . Cystoscopy w/ ureteroscopy  20 YRS AGO  (APPROX. 1990'S)  . Cystoscopy w/ retrogrades  12/25/2011    Procedure: CYSTOSCOPY WITH RETROGRADE PYELOGRAM;  Surgeon: Marcine Matar, MD;  Location: [facility];  Service: Urology;  Laterality: Bilateral;    Family History  Problem Relation Age of Onset  . CVA Father   . Healthy Sister   . Healthy Son   . Healthy Son   . Heart attack Mother   . Heart attack Brother     History   Social History  . Marital Status:  Married    Spouse Name: N/A    Number of Children: N/A  . Years of Education: N/A   Occupational History  . Not on file.   Social History Main Topics  . Smoking status: Never Smoker   . Smokeless tobacco: Never Used  . Alcohol Use: No  . Drug Use: No  . Sexually Active: Not on file   Other Topics Concern  . Not on file   Social History Narrative  . No narrative on file    Review of systems: The patient specifically denies any chest pain at rest or with exertion, dyspnea at rest or with exertion, orthopnea, paroxysmal nocturnal dyspnea, syncope, palpitations, focal neurological deficits, intermittent claudication, lower extremity edema, unexplained weight gain, cough, hemoptysis or wheezing.  The  patient also denies abdominal pain, nausea, vomiting, dysphagia, diarrhea, constipation, polyuria, polydipsia, dysuria, hematuria, frequency, urgency, abnormal bleeding or bruising, fever, chills, unexpected weight changes, mood swings, change in skin or hair texture, change in voice quality, auditory or visual problems, allergic reactions or rashes, new musculoskeletal complaints other than usual "aches and pains".   PHYSICAL EXAM BP 162/99  Pulse 66  Resp 16  Ht 5\' 10"  (1.778 m)  Wt 92.534 kg (204 lb)  BMI 29.27 kg/m2  General: Alert, oriented x3, no distress Head: no evidence of trauma, PERRL, EOMI, no exophtalmos or lid lag, no myxedema, no xanthelasma; normal ears, nose and oropharynx Neck: normal jugular venous pulsations and no hepatojugular reflux; brisk carotid pulses without delay and no carotid bruits Chest: clear to auscultation, no signs of consolidation by percussion or palpation, normal fremitus, symmetrical and full respiratory excursions Cardiovascular: normal position and quality of the apical impulse, regular rhythm, normal first and second heart sounds, no murmurs, rubs or gallops Abdomen: no tenderness or distention, no masses by palpation, no abnormal pulsatility or arterial bruits, normal bowel sounds, no hepatosplenomegaly Extremities: no clubbing, cyanosis or edema; 2+ radial, ulnar and brachial pulses bilaterally; 2+ right femoral, posterior tibial and dorsalis pedis pulses; 2+ left femoral, posterior tibial and dorsalis pedis pulses; no subclavian or femoral bruits Neurological: grossly nonfocal   EKG: Normal sinus rhythm  Lipid Panel in June 2012 total cholesterol 170, Tikosyn is 94, HDL 34, LDL 117 No results found for this basename: chol, trig, hdl, cholhdl, vldl, ldlcalc    BMET    Component Value Date/Time   NA 144 12/25/2011 1032   K 4.0 12/25/2011 1032   GLUCOSE 98 12/25/2011 1032     ASSESSMENT AND PLAN Essential hypertension, benign  Markedly  elevated blood pressure today, probably because he has reduced h antihypertensive meds in half. He'll continue taking the bisoprolol/hydrochlorothiazide in the evening only and lisinopril 5 mg daily in the morning  NSVT (nonsustained ventricular tachycardia) Incidentally noted on an arrhythmia monitor. This has had no temporal correlation with his complaints of dizziness  Hypercholesterolemia Elevated LDL cholesterol and low HDL cholesterol, but no known cardiac/vascular/coronary problems. At this junction lipid-lowering therapy with medications isn't justified but he is encouraged to lose weight. He is close to being obese.  Bring back in several weeks to see the effect of antihypertensive medication changes Orders Placed This Encounter  Procedures  . EKG 12-Lead   Meds ordered this encounter  Medications  . sildenafil (VIAGRA) 100 MG tablet    Sig: Take 100 mg by mouth daily as needed for erectile dysfunction.  Marland Kitchen DISCONTD: lisinopril (PRINIVIL,ZESTRIL) 5 MG tablet    Sig: Take 1 tablet (5 mg total) by mouth  every morning.    Dispense:  90 tablet    Refill:  3  . lisinopril (PRINIVIL,ZESTRIL) 5 MG tablet    Sig: Take 1 tablet (5 mg total) by mouth daily.    Dispense:  90 tablet    Refill:  3    Avni Traore  Thurmon Fair, MD, [facility] [phone] office [phone] pager

## 2012-11-28 NOTE — Telephone Encounter (Signed)
Just saw Dr C a few minutes ago-he put him on new blood pressure medicine-Wants to know if he can still take Vigaara with the new medicine?

## 2012-11-28 NOTE — Telephone Encounter (Signed)
Message forwarded to Dr. Royann Shivers for advice.

## 2012-11-28 NOTE — Telephone Encounter (Signed)
Returned call and informed pt per instructions by MD/PA.  Pt verbalized understanding and agreed w/ plan.  

## 2012-12-20 ENCOUNTER — Encounter: Payer: Self-pay | Admitting: Cardiovascular Disease

## 2012-12-21 DIAGNOSIS — E7800 Pure hypercholesterolemia, unspecified: Secondary | ICD-10-CM | POA: Insufficient documentation

## 2012-12-21 DIAGNOSIS — I472 Ventricular tachycardia: Secondary | ICD-10-CM | POA: Insufficient documentation

## 2012-12-21 NOTE — Assessment & Plan Note (Signed)
Markedly elevated blood pressure today, probably because he has reduced h antihypertensive meds in half. He'll continue taking the bisoprolol/hydrochlorothiazide in the evening only and lisinopril 5 mg daily in the morning

## 2012-12-21 NOTE — Assessment & Plan Note (Signed)
Elevated LDL cholesterol and low HDL cholesterol, but no known cardiac/vascular/coronary problems. At this junction lipid-lowering therapy with medications isn't justified but he is encouraged to lose weight. He is close to being obese.

## 2012-12-21 NOTE — Assessment & Plan Note (Signed)
Incidentally noted on an arrhythmia monitor. This has had no temporal correlation with his complaints of dizziness

## 2013-01-11 ENCOUNTER — Encounter: Payer: Self-pay | Admitting: Cardiovascular Disease

## 2013-01-11 ENCOUNTER — Ambulatory Visit (INDEPENDENT_AMBULATORY_CARE_PROVIDER_SITE_OTHER): Payer: 59 | Admitting: Cardiovascular Disease

## 2013-01-11 VITALS — BP 138/86 | HR 72 | Resp 16 | Ht 70.0 in | Wt 200.7 lb

## 2013-01-11 DIAGNOSIS — N529 Male erectile dysfunction, unspecified: Secondary | ICD-10-CM

## 2013-01-11 DIAGNOSIS — E7800 Pure hypercholesterolemia, unspecified: Secondary | ICD-10-CM

## 2013-01-11 DIAGNOSIS — I10 Essential (primary) hypertension: Secondary | ICD-10-CM

## 2013-01-11 MED ORDER — BISOPROLOL-HYDROCHLOROTHIAZIDE 5-6.25 MG PO TABS: 1.0000 | ORAL_TABLET | Freq: Every evening | ORAL | Status: DC

## 2013-01-11 MED ORDER — LISINOPRIL 5 MG PO TABS: 5.0000 mg | ORAL_TABLET | Freq: Every day | ORAL | Status: DC

## 2013-01-11 NOTE — Patient Instructions (Signed)
Your physician recommends that you schedule a follow-up appointment in: 1 year  

## 2013-01-11 NOTE — Assessment & Plan Note (Signed)
His blood pressure is now within the desirable range and he will continue the same treatment regimen. He has mild symptoms of orthostatic hypotension and recommended that he make sure he remains well hydrated, especially since he takes a thiazide diuretic

## 2013-01-11 NOTE — Progress Notes (Signed)
Patient ID: Derrick [name], male   DOB: 1951-01-18, 62 y.o.   MRN: [id number]     Reason for office visit Hypertension  He returns in followup after an adjustment in his hypertension medicines. He feels well. He no longer has a wobbly sensation when he walks. He does have some mild dizziness when he jumps up from a chair consistent with orthostatic hypotension. He denies chest pain shortness of breath or syncope. He has not had palpitations. He shows me several recordings on his home blood pressure monitor and his blood pressure has been consistently in the 115-130/70-80 mm Hg range. His blood pressure is just slightly higher in the office today.    Allergies  Allergen Reactions  . Viagra (Sildenafil Citrate)     Causes a headache, also occurs with Cialis    Current Outpatient Prescriptions  Medication Sig Dispense Refill  . aspirin 81 MG tablet Take 81 mg by mouth daily.      . bisoprolol-hydrochlorothiazide (ZIAC) 5-6.25 MG per tablet Take 1 tablet by mouth every evening.  90 tablet  3  . levothyroxine (SYNTHROID, LEVOTHROID) 25 MCG tablet Take 50 mcg by mouth every morning.       Marland Kitchen lisinopril (PRINIVIL,ZESTRIL) 5 MG tablet Take 1 tablet (5 mg total) by mouth daily.  90 tablet  3  . sildenafil (VIAGRA) 100 MG tablet Take 100 mg by mouth daily as needed for erectile dysfunction.       No current facility-administered medications for this visit.    Past Medical History  Diagnosis Date  . Hematuria   . Arthritis NECK  . S/P cervical spinal fusion   . Hypertension CARDIOLOGIST- [facility]    STRESS TEST YRS AGO-- OK  ; LAST VISIT 1 MONTH AGO  . Hypothyroidism   . History of prostatitis   . ED (erectile dysfunction)     intolerant to viagra and cialis  . Hyperlipidemia   . Palpitations     event monitor 03-2012-showed a single 6 beat run of NSVT; echo 05/25/12-EF55-60%, mild LVH  . Chest pain, atypical 2005    myoview 09/13/03-normal perfusion  . Vertigo 2005    nl  carotid dopplers 08/09/2003    Past Surgical History  Procedure Laterality Date  . Anterior cervical decomp/discectomy fusion  07-29-2000    C4 - C7  . Cystoscopy w/ ureteroscopy  20 YRS AGO  (APPROX. 1990'S)  . Cystoscopy w/ retrogrades  12/25/2011    Procedure: CYSTOSCOPY WITH RETROGRADE PYELOGRAM;  Surgeon: Marcine Matar, MD;  Location: [facility];  Service: Urology;  Laterality: Bilateral;    Family History  Problem Relation Age of Onset  . CVA Father   . Healthy Sister   . Healthy Son   . Healthy Son   . Heart attack Mother   . Heart attack Brother     History   Social History  . Marital Status: Married    Spouse Name: N/A    Number of Children: N/A  . Years of Education: N/A   Occupational History  . Not on file.   Social History Main Topics  . Smoking status: Never Smoker   . Smokeless tobacco: Never Used  . Alcohol Use: No  . Drug Use: No  . Sexually Active: Not on file   Other Topics Concern  . Not on file   Social History Narrative  . No narrative on file    Review of systems: The patient specifically denies any chest pain at rest  or with exertion, dyspnea at rest or with exertion, orthopnea, paroxysmal nocturnal dyspnea, syncope, palpitations, focal neurological deficits, intermittent claudication, lower extremity edema, unexplained weight gain, cough, hemoptysis or wheezing.  He has mild erectile dysfunction and only needs very low doses of Viagra.  The patient also denies abdominal pain, nausea, vomiting, dysphagia, diarrhea, constipation, polyuria, polydipsia, dysuria, hematuria, frequency, urgency, abnormal bleeding or bruising, fever, chills, unexpected weight changes, mood swings, change in skin or hair texture, change in voice quality, auditory or visual problems, allergic reactions or rashes, new musculoskeletal complaints other than usual "aches and pains".   PHYSICAL EXAM BP 138/86  Pulse 72  Resp 16  Ht 5\' 10"  (1.778 m)   Wt 200 lb 11.2 oz (91.037 kg)  BMI 28.8 kg/m2  General: Alert, oriented x3, no distress Head: no evidence of trauma, PERRL, EOMI, no exophtalmos or lid lag, no myxedema, no xanthelasma; normal ears, nose and oropharynx Neck: normal jugular venous pulsations and no hepatojugular reflux; brisk carotid pulses without delay and no carotid bruits Chest: clear to auscultation, no signs of consolidation by percussion or palpation, normal fremitus, symmetrical and full respiratory excursions Cardiovascular: normal position and quality of the apical impulse, regular rhythm, normal first and second heart sounds, no murmurs, rubs or gallops Abdomen: no tenderness or distention, no masses by palpation, no abnormal pulsatility or arterial bruits, normal bowel sounds, no hepatosplenomegaly Extremities: no clubbing, cyanosis or edema; 2+ radial, ulnar and brachial pulses bilaterally; 2+ right femoral, posterior tibial and dorsalis pedis pulses; 2+ left femoral, posterior tibial and dorsalis pedis pulses; no subclavian or femoral bruits Neurological: grossly nonfocal   EKG: nsr   BMET    Component Value Date/Time   NA 144 12/25/2011 1032   K 4.0 12/25/2011 1032   GLUCOSE 98 12/25/2011 1032     ASSESSMENT AND PLAN Essential hypertension, benign His blood pressure is now within the desirable range and he will continue the same treatment regimen. He has mild symptoms of orthostatic hypotension and recommended that he make sure he remains well hydrated, especially since he takes a thiazide diuretic  Erectile dysfunction I reinforced the critical importance of avoidance of simultaneous treatment with Viagra and nitrate-containing medications.  No orders of the defined types were placed in this encounter.   Meds ordered this encounter  Medications  . bisoprolol-hydrochlorothiazide (ZIAC) 5-6.25 MG per tablet    Sig: Take 1 tablet by mouth every evening.    Dispense:  90 tablet    Refill:  3  .  lisinopril (PRINIVIL,ZESTRIL) 5 MG tablet    Sig: Take 1 tablet (5 mg total) by mouth daily.    Dispense:  90 tablet    Refill:  3    Viridiana Spaid  Thurmon Fair, MD, [facility] [phone] office [phone] pager

## 2013-01-11 NOTE — Assessment & Plan Note (Signed)
I reinforced the critical importance of avoidance of simultaneous treatment with Viagra and nitrate-containing medications.

## 2013-02-09 ENCOUNTER — Telehealth: Payer: Self-pay | Admitting: Cardiovascular Disease

## 2013-02-09 NOTE — Telephone Encounter (Signed)
Please call-question about his blood pressure medicine.

## 2013-02-09 NOTE — Telephone Encounter (Signed)
Returned call.  Pt stated Dr. Salena Saner put him on a new BP med, lisinopril.  Stated he feels great, better than he ever has.  Stated BP has been 100s-110s at night.  Stated it was 95/62 one night.  Denied having any symptoms: dizziness, lightheadedness.  Pt advised to continue current regimen and if BP 90/60 or below to hold evening BP med (Ziac) and call the office if symptomatic.  Pt verbalized understanding.  Asked if there was a way for him to send a message to the office with his BP readings.  Informed his MyChart code is still pending and can be resent if needed.  Pt verbalized understanding and asked that it be resent.  Informed will mail out today.  New code generated and letter mailed.

## 2013-02-13 ENCOUNTER — Ambulatory Visit (INDEPENDENT_AMBULATORY_CARE_PROVIDER_SITE_OTHER): Payer: 59 | Admitting: Internal Medicine

## 2013-07-14 ENCOUNTER — Telehealth: Payer: Self-pay | Admitting: Cardiovascular Disease

## 2013-07-14 ENCOUNTER — Telehealth: Payer: Self-pay | Admitting: *Deleted

## 2013-07-14 NOTE — Telephone Encounter (Signed)
Returned call and pt verified x 2.  Pt informed message received and refill was sent on 7.16.14 for 90-day w/ 3 refills.    Call to pharmacy and informed insurance will only pay for 30-day supply.  Stated pt can get on [facility] card for $15.99 for a 90-day supply.  Advised pt call back to get more information about the [facility] card.  Call to pt and informed as stated above.  Pt will call pharmacy for more information.

## 2013-07-14 NOTE — Telephone Encounter (Signed)
Would like to have his Bisotrolol HCTZ 5.6 needs a prescription for a [facility] [phone]....   Thanks

## 2013-07-14 NOTE — Telephone Encounter (Signed)
Rx for bisoprolol-hctz was sent on 7.16.14 for 90-day supply w/ 3 refills.  New Rx not needed until July 2015.  Please inform pt.  He may need to have pharmacy check records for script sent on that date.  Thanks. ~ Continental Airlinesmber M. Lorin PicketScott, BSN, RN

## 2013-07-14 NOTE — Telephone Encounter (Signed)
Pt stated that he called the pharmacy and they told him that he needed a new Rx for the Ziac. He would like a 90 day supply called in.  MC

## 2013-08-14 ENCOUNTER — Ambulatory Visit (INDEPENDENT_AMBULATORY_CARE_PROVIDER_SITE_OTHER): Payer: 59 | Admitting: Internal Medicine

## 2013-08-14 ENCOUNTER — Encounter (INDEPENDENT_AMBULATORY_CARE_PROVIDER_SITE_OTHER): Payer: Self-pay | Admitting: Internal Medicine

## 2013-08-14 VITALS — BP 122/84 | HR 76 | Temp 98.7°F | Resp 18 | Ht 70.0 in | Wt 205.4 lb

## 2013-08-14 DIAGNOSIS — R1012 Left upper quadrant pain: Secondary | ICD-10-CM

## 2013-08-14 NOTE — Patient Instructions (Signed)
Call if abdominal pain or discomfort gets worse.
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
## 2013-09-21 ENCOUNTER — Telehealth: Payer: Self-pay | Admitting: *Deleted

## 2013-09-21 NOTE — Telephone Encounter (Signed)
Pt stated he has enough red pills to last for a while and they are not expired.

## 2013-09-21 NOTE — Telephone Encounter (Signed)
Returned call and pt verified x 2.  Pt stated he has been getting the generic for bisoprolol-hctz and have been on them for a long time.  Stated they changed manufacturers and the new pill makes his head feel like he's in a fog.  Stated he cannot take the new pink pill.  Stated he thinks he remembers taking the blue pill and doesn't remember having any problems.    Pt informed RN will call pharmacy to find out what options he has.  Called pharmacy and spoke w/ pharmacist.  Stated the current pill pt has was filled in July 2014 and in January 2015, both for 90-days.  Stated pt has never received another pill for Ziac w/ them and they do not have another manufacturer listed for this med.  Call to pt and informed.  Verbalized understanding and wanted to know if he could see Dr. Salena Saner sooner.  Pt was advised to try to find manufacturer for the blue pill and pharmacist stated she would try to order it for him.  Pt preferred to come in to see Dr. Salena Saner.  Pt scheduled first available appt w/ Dr. Salena Saner on Monday at 9:45am.  Stated he also wants to get his labs done for Dr. Salena Saner and Dr. Regino SchultzeMcgough.  Stated he will bring in the information.

## 2013-09-21 NOTE — Telephone Encounter (Signed)
Pt was calling in regards to his medication. He stated that his pharmacy gave him a different manufacturer and it is effecting him funny.  MC

## 2013-09-25 ENCOUNTER — Ambulatory Visit (INDEPENDENT_AMBULATORY_CARE_PROVIDER_SITE_OTHER): Payer: 59 | Admitting: Cardiovascular Disease

## 2013-09-25 ENCOUNTER — Encounter: Payer: Self-pay | Admitting: Cardiovascular Disease

## 2013-09-25 VITALS — BP 138/86 | HR 70 | Resp 16 | Ht 70.0 in | Wt 205.7 lb

## 2013-09-25 DIAGNOSIS — I10 Essential (primary) hypertension: Secondary | ICD-10-CM

## 2013-09-25 NOTE — Patient Instructions (Signed)
Your physician recommends that you schedule a follow-up appointment in: ONE YEAR 

## 2013-10-10 ENCOUNTER — Other Ambulatory Visit: Payer: Self-pay | Admitting: Cardiovascular Disease

## 2013-10-10 NOTE — Telephone Encounter (Signed)
Rx was sent to pharmacy electronically. 

## 2013-10-12 ENCOUNTER — Encounter: Payer: Self-pay | Admitting: Cardiovascular Disease

## 2013-10-12 NOTE — Progress Notes (Signed)
Patient ID: Derrick [name], male   DOB: 27-Jan-1951, 63 y.o.   MRN: [id number]      Reason for office visit Dizziness  He had a reaction to Ziac due to manufacturer change: had dizzy spells and light headeness, moved up one year follow up from July to make sure everything was okay. He has a history of hypertension and erectile dysfunction. A brief episode of nonsustained ventricular tachycardia (6 beats) was discovered on 30 day event monitor performed in 2013 echocardiography performed around that time shows normal left ventricular systolic function, wall thickness and size and the absence of regional wall motion abnormalities. No valvular problems were seen. A nuclear stress test has not been performed in 10 years but in 2005 he had a normal study. He has treated hypothyroidism. He has low HDL cholesterol.    Allergies  Allergen Reactions  . Viagra [Sildenafil Citrate]     Causes a headache, also occurs with Cialis    Current Outpatient Prescriptions  Medication Sig Dispense Refill  . aspirin 81 MG tablet Take 81 mg by mouth daily.      . bisoprolol-hydrochlorothiazide (ZIAC) 5-6.25 MG per tablet Take 1 tablet by mouth every evening.  90 tablet  3  . levothyroxine (SYNTHROID, LEVOTHROID) 50 MCG tablet Take 50 mcg by mouth daily before breakfast.      . VIAGRA 100 MG tablet Take 100 mg by mouth as needed.      Marland Kitchen. lisinopril (PRINIVIL,ZESTRIL) 5 MG tablet take 1 tablet by mouth once daily  90 tablet  0   No current facility-administered medications for this visit.    Past Medical History  Diagnosis Date  . Hematuria   . Arthritis NECK  . S/P cervical spinal fusion   . Hypertension CARDIOLOGIST- [facility]    STRESS TEST YRS AGO-- OK  ; LAST VISIT 1 MONTH AGO  . Hypothyroidism   . History of prostatitis   . ED (erectile dysfunction)     intolerant to viagra and cialis  . Hyperlipidemia   . Palpitations     event monitor 03-2012-showed a single 6 beat run of NSVT; echo  05/25/12-EF55-60%, mild LVH  . Chest pain, atypical 2005    myoview 09/13/03-normal perfusion  . Vertigo 2005    nl carotid dopplers 08/09/2003    Past Surgical History  Procedure Laterality Date  . Anterior cervical decomp/discectomy fusion  07-29-2000    C4 - C7  . Cystoscopy w/ ureteroscopy  20 YRS AGO  (APPROX. 1990'S)  . Cystoscopy w/ retrogrades  12/25/2011    Procedure: CYSTOSCOPY WITH RETROGRADE PYELOGRAM;  Surgeon: Marcine MatarStephen Dahlstedt, MD;  [facility];  Service: Urology;  Laterality: Bilateral;    Family History  Problem Relation Age of Onset  . CVA Father   . Healthy Sister   . Healthy Son   . Healthy Son   . Heart attack Mother   . Heart attack Brother     History   Social History  . Marital Status: Married    Spouse Name: N/A    Number of Children: N/A  . Years of Education: N/A   Occupational History  . Not on file.   Social History Main Topics  . Smoking status: Never Smoker   . Smokeless tobacco: Never Used  . Alcohol Use: No  . Drug Use: No  . Sexual Activity: Not on file   Other Topics Concern  . Not on file   Social History Narrative  . No  narrative on file    Review of systems: The patient specifically denies any chest pain at rest or with exertion, dyspnea at rest or with exertion, orthopnea, paroxysmal nocturnal dyspnea, syncope, palpitations, focal neurological deficits, intermittent claudication, lower extremity edema, unexplained weight gain, cough, hemoptysis or wheezing.  The patient also denies abdominal pain, nausea, vomiting, dysphagia, diarrhea, constipation, polyuria, polydipsia, dysuria, hematuria, frequency, urgency, abnormal bleeding or bruising, fever, chills, unexpected weight changes, mood swings, change in skin or hair texture, change in voice quality, auditory or visual problems, allergic reactions or rashes, new musculoskeletal complaints other than usual "aches and pains".   PHYSICAL EXAM BP 138/86   Pulse 70  Resp 16  Ht 5\' 10"  (1.778 m)  Wt 205 lb 11.2 oz (93.305 kg)  BMI 29.51 kg/m2 The patient specifically denies any chest pain at rest or with exertion, dyspnea at rest or with exertion, orthopnea, paroxysmal nocturnal dyspnea, syncope, palpitations, focal neurological deficits, intermittent claudication, lower extremity edema, unexplained weight gain, cough, hemoptysis or wheezing.  The patient also denies abdominal pain, nausea, vomiting, dysphagia, diarrhea, constipation, polyuria, polydipsia, dysuria, hematuria, frequency, urgency, abnormal bleeding or bruising, fever, chills, unexpected weight changes, mood swings, change in skin or hair texture, change in voice quality, auditory or visual problems, allergic reactions or rashes, new musculoskeletal complaints other than usual "aches and pains".   EKG: NSR, normal  Lipid Panel  Total cholesterol 170, triglyceride 94, HDL 34, LDL 117  BMET    Component Value Date/Time   NA 144 12/25/2011 1032   K 4.0 12/25/2011 1032   GLUCOSE 98 12/25/2011 1032     ASSESSMENT AND PLAN  His dizziness might indeed have been related to a change in the manufacture of his antihypertensive. It is possible that he is absorbing more of the active ingredients. Things seem to have stabilized now and I did not think it necessary to make any change in his medications. If anything, his blood pressure is borderline high. Will call back if his symptoms return.  Patient Instructions  Your physician recommends that you schedule a follow-up appointment in: ONE YEAR.    Orders Placed This Encounter  Procedures  . EKG 12-Lead   Meds ordered this encounter  Medications  . levothyroxine (SYNTHROID, LEVOTHROID) 50 MCG tablet    Sig: Take 50 mcg by mouth daily before breakfast.  . VIAGRA 100 MG tablet    Sig: Take 100 mg by mouth as needed.    Jadalyn Oliveri  Thurmon FairMihai Megon Kalina, MD, [facility] [phone] office [phone] pager

## 2013-12-21 ENCOUNTER — Other Ambulatory Visit: Payer: Self-pay | Admitting: Cardiovascular Disease

## 2013-12-22 NOTE — Telephone Encounter (Signed)
Rx refill sent to patient pharmacy   

## 2014-03-17 ENCOUNTER — Other Ambulatory Visit: Payer: Self-pay | Admitting: Cardiovascular Disease

## 2014-03-19 ENCOUNTER — Ambulatory Visit (INDEPENDENT_AMBULATORY_CARE_PROVIDER_SITE_OTHER): Payer: 59 | Admitting: Internal Medicine

## 2014-03-19 NOTE — Telephone Encounter (Signed)
Rx was sent to pharmacy electronically. 

## 2014-03-21 ENCOUNTER — Telehealth (INDEPENDENT_AMBULATORY_CARE_PROVIDER_SITE_OTHER): Payer: Self-pay | Admitting: *Deleted

## 2014-03-21 ENCOUNTER — Encounter (INDEPENDENT_AMBULATORY_CARE_PROVIDER_SITE_OTHER): Payer: Self-pay | Admitting: *Deleted

## 2014-03-21 NOTE — Telephone Encounter (Signed)
Derrick [name] NO SHOWED for his apt on 03/19/14 with Dr. Karilyn Cota. A NS letter has been mailed.

## 2014-05-25 ENCOUNTER — Other Ambulatory Visit: Payer: Self-pay | Admitting: *Deleted

## 2014-05-25 MED ORDER — BISOPROLOL-HYDROCHLOROTHIAZIDE 5-6.25 MG PO TABS: 1.0000 | ORAL_TABLET | Freq: Every evening | ORAL | Status: DC

## 2014-05-28 ENCOUNTER — Ambulatory Visit (INDEPENDENT_AMBULATORY_CARE_PROVIDER_SITE_OTHER): Payer: 59 | Admitting: Internal Medicine

## 2014-05-28 ENCOUNTER — Encounter (INDEPENDENT_AMBULATORY_CARE_PROVIDER_SITE_OTHER): Payer: Self-pay | Admitting: Internal Medicine

## 2014-05-28 VITALS — BP 126/78 | HR 76 | Temp 98.7°F | Resp 18 | Ht 70.0 in | Wt 199.6 lb

## 2014-05-28 DIAGNOSIS — Z87898 Personal history of other specified conditions: Secondary | ICD-10-CM

## 2014-05-28 DIAGNOSIS — Z860100 Personal history of colon polyps, unspecified: Secondary | ICD-10-CM

## 2014-05-28 NOTE — Progress Notes (Signed)
]     Note Status Last Update User Last Update Date/Time    Malissa HippoNajeeb U Tatum Massman, MD Sign at close encounter Malissa HippoNajeeb U Benicia Bergevin, MD 05/28/2014 10:31 PM    Progress Notes    Expand All Collapse All   Presenting complaint;  Follow-up for left upper quadrant abdominal pain.  Subjective:  Patient is [age] old Caucasian male who is here for scheduled visit. He was last seen for every 2015 for persistent left upper quadrant abdominal pain which was felt to be musculoskeletal. He states he hasn't had pain in over 6 months and he is not convinced it was musculoskeletal and not visceral pain. He denies nausea vomiting melena or rectal bleeding. His bowels move every other day. He says he had complete blood work by Dr. Regino SchultzeMcGough 4 months ago and it was normal. He stays active. He right spike 4 times a week and usually 8 miles each time. On other days he walks at least 2 miles a day. He has heartburn only if he eats banana at night. He has no Robin defeats bananas during the daytime. He also has intermittent throat irritation which he believes is due to allergy was patent. He wants to know the timing of his next colonoscopy. He has history of colonic adenomas.   Current Medications: Outpatient Encounter Prescriptions as of 05/28/2014  Medication Sig  . ALPRAZolam (XANAX) 1 MG tablet Take 1 mg by mouth as needed for sleep.   Marland Kitchen. aspirin 81 MG tablet Take 81 mg by mouth daily.  . bisoprolol-hydrochlorothiazide (ZIAC) 5-6.25 MG per tablet Take 1 tablet by mouth every evening.  Marland Kitchen. levothyroxine (SYNTHROID, LEVOTHROID) 50 MCG tablet Take 50 mcg by mouth daily before breakfast.  . lisinopril (PRINIVIL,ZESTRIL) 5 MG tablet take 1 tablet by mouth once daily  . VIAGRA 100 MG tablet Take 100 mg by mouth as needed.     Objective: Blood pressure 126/78, pulse 76, temperature 98.7 F (37.1 C), temperature source Oral, resp. rate 18, height 5\' 10"  (1.778 m), weight 199 lb 9.6 oz (90.538 kg). Patient  is alert and in no acute distress. Conjunctiva is pink. Sclera is nonicteric Oropharyngeal mucosa is normal. No neck masses or thyromegaly noted. Cardiac exam with regular rhythm normal S1 and S2. No murmur or gallop noted. Lungs are clear to auscultation. Abdomen is symmetrical soft and nontender without organomegaly or masses. No LE edema or clubbing noted.   Assessment:  #1. History of left upper quadrant abdominal pain which has finally subsided. Etiology was felt to be musculoskeletal. #2. History of colonic adenomas. Last colonoscopy was in July 2011.   Plan:  Surveillance colonoscopy in June or July 2016. Patient will call if abdominal pain recurs.

## 2014-05-28 NOTE — Patient Instructions (Signed)
Colonoscopy be scheduled in June or July 2016. Call if abdominal pain recurs.

## 2014-05-28 NOTE — Progress Notes (Signed)
Presenting complaint;  Follow-up for left upper quadrant abdominal pain.  Subjective:  Patient is [age] old Caucasian male who is here for scheduled visit. He was last seen for every 2015 for persistent left upper quadrant abdominal pain which was felt to be musculoskeletal. He states he hasn't had pain in over 6 months and he is not convinced it was musculoskeletal and not visceral pain. He denies nausea vomiting melena or rectal bleeding. His bowels move every other day. He says he had complete blood work by Dr. Regino SchultzeMcGough 4 months ago and it was normal. He stays active. He right spike 4 times a week and usually 8 miles each time. On other days he walks at least 2 miles a day. He has heartburn only if he eats banana at night. He has no Robin defeats bananas during the daytime. He also has intermittent throat irritation which he believes is due to allergy was patent. He wants to know the timing of his next colonoscopy. He has history of colonic adenomas.   Current Medications: Outpatient Encounter Prescriptions as of 05/28/2014  Medication Sig  . ALPRAZolam (XANAX) 1 MG tablet Take 1 mg by mouth as needed for sleep.   Marland Kitchen. aspirin 81 MG tablet Take 81 mg by mouth daily.  . bisoprolol-hydrochlorothiazide (ZIAC) 5-6.25 MG per tablet Take 1 tablet by mouth every evening.  Marland Kitchen. levothyroxine (SYNTHROID, LEVOTHROID) 50 MCG tablet Take 50 mcg by mouth daily before breakfast.  . lisinopril (PRINIVIL,ZESTRIL) 5 MG tablet take 1 tablet by mouth once daily  . VIAGRA 100 MG tablet Take 100 mg by mouth as needed.     Objective: Blood pressure 126/78, pulse 76, temperature 98.7 F (37.1 C), temperature source Oral, resp. rate 18, height 5\' 10"  (1.778 m), weight 199 lb 9.6 oz (90.538 kg). Patient is alert and in no acute distress. Conjunctiva is pink. Sclera is nonicteric Oropharyngeal mucosa is normal. No neck masses or thyromegaly noted. Cardiac exam with regular rhythm normal S1 and S2. No murmur or gallop  noted. Lungs are clear to auscultation. Abdomen is symmetrical soft and nontender without organomegaly or masses. No LE edema or clubbing noted.   Assessment:  #1. History of left upper quadrant abdominal pain which has finally subsided. Etiology was felt to be musculoskeletal. #2. History of colonic adenomas. Last colonoscopy was in July 2011.   Plan:  Surveillance colonoscopy in June or July 2016. Patient will call if abdominal pain recurs.

## 2014-07-16 ENCOUNTER — Other Ambulatory Visit: Payer: Self-pay | Admitting: Cardiovascular Disease

## 2014-07-16 NOTE — Telephone Encounter (Signed)
Rx(s) sent to pharmacy electronically. 09/27/14

## 2014-07-17 ENCOUNTER — Telehealth: Payer: Self-pay | Admitting: Cardiovascular Disease

## 2014-07-17 MED ORDER — BISOPROLOL-HYDROCHLOROTHIAZIDE 5-6.25 MG PO TABS: 1.0000 | ORAL_TABLET | Freq: Every evening | ORAL | Status: DC

## 2014-07-17 NOTE — Telephone Encounter (Signed)
Refills sent electronically to the pharmacy.

## 2014-07-17 NOTE — Telephone Encounter (Signed)
°  1. Which medications need to be refilled? Lisinopril and Bisoprolol   2. Which pharmacy is medication to be sent [facility] [address]  3. Do they need a 30 day or 90 day supply? 90 days   4. Would they like a call back once the medication has been sent to the pharmacy? No  *Been having problems getting it refilled for a 90 day supply*

## 2014-07-23 ENCOUNTER — Telehealth: Payer: Self-pay | Admitting: Cardiovascular Disease

## 2014-07-23 MED ORDER — LISINOPRIL 5 MG PO TABS: 5.0000 mg | ORAL_TABLET | Freq: Every day | ORAL | Status: DC

## 2014-07-23 NOTE — Telephone Encounter (Signed)
Refill submitted to pharmacy of choice. 

## 2014-07-23 NOTE — Telephone Encounter (Signed)
Need a new prescription for Lisinopril 5 mg #90 instead of 30 and refills. Please call to [phone].

## 2014-09-24 ENCOUNTER — Telehealth: Payer: Self-pay | Admitting: Cardiovascular Disease

## 2014-09-25 NOTE — Telephone Encounter (Signed)
Close encounter 

## 2014-09-27 ENCOUNTER — Ambulatory Visit: Payer: 59 | Admitting: Cardiovascular Disease

## 2014-09-28 ENCOUNTER — Encounter: Payer: Self-pay | Admitting: Cardiovascular Disease

## 2014-09-28 ENCOUNTER — Ambulatory Visit (INDEPENDENT_AMBULATORY_CARE_PROVIDER_SITE_OTHER): Payer: 59 | Admitting: Cardiovascular Disease

## 2014-09-28 VITALS — BP 140/82 | HR 63 | Resp 16 | Ht 70.0 in | Wt 197.4 lb

## 2014-09-28 DIAGNOSIS — E7800 Pure hypercholesterolemia, unspecified: Secondary | ICD-10-CM

## 2014-09-28 DIAGNOSIS — I4729 Other ventricular tachycardia: Secondary | ICD-10-CM

## 2014-09-28 DIAGNOSIS — I472 Ventricular tachycardia: Secondary | ICD-10-CM | POA: Diagnosis not present

## 2014-09-28 DIAGNOSIS — I10 Essential (primary) hypertension: Secondary | ICD-10-CM

## 2014-09-28 DIAGNOSIS — R0602 Shortness of breath: Secondary | ICD-10-CM | POA: Diagnosis not present

## 2014-09-28 NOTE — Patient Instructions (Signed)
Your physician has requested that you have an echocardiogram. Echocardiography is a painless test that uses sound waves to create images of your heart. It provides your doctor with information about the size and shape of your heart and how well your heart's chambers and valves are working. This procedure takes approximately one hour. There are no restrictions for this procedure.   Your physician has requested that you have an exercise tolerance test. For further information please visit [email or web address]. Please also follow instruction sheet, as given.   Dr. Royann Shiversroitoru recommends that you schedule a follow-up appointment in: One year.

## 2014-09-29 ENCOUNTER — Encounter: Payer: Self-pay | Admitting: Cardiovascular Disease

## 2014-09-29 NOTE — Progress Notes (Signed)
Patient ID: Derrick [name], male   DOB: [birth year], 64 y.o.   MRN: [id number]     Cardiology Office Note   Date:  09/29/2014   ID:  Derrick [name], DOB [birth year], MRN [id number]  PCP:  Kirk RuthsMCGOUGH,WILLIAM M, MD  Cardiologist:   Thurmon FairROITORU,Rayn Enderson, MD   Chief Complaint  Patient presents with  . Follow-up    Yearly:  No complaints of chest pain, SOB, edema .  Occas. palpitations, occas. positional dizziness.      History of Present Illness: Derrick [name] is a 64 y.o. male who presents for increasing frequency of palpitations. He still walks 2-4 miles or bikes 8 miles almost every day, but feels some reduction in exercise ability.   He has a history of hypertension and erectile dysfunction and nonsustained ventricular tachycardia (6 beats) discovered on 30 day event monitor performed in 2013. Echocardiography performed around that time shows normal left ventricular systolic function, wall thickness and size and the absence of regional wall motion abnormalities. No valvular problems were seen. A nuclear stress test has not been performed in 10 years but in 2005 he had a normal study. He has treated hypothyroidism. He has low HDL cholesterol.  Past Medical History  Diagnosis Date  . Hematuria   . Arthritis NECK  . S/P cervical spinal fusion   . Hypertension C[facility]IOLOGIST- [facility]    STRESS TEST YRS AGO-- OK  ; LAST VISIT 1 MONTH AGO  . Hypothyroidism   . History of prostatitis   . ED (erectile dysfunction)     intolerant to viagra and cialis  . Hyperlipidemia   . Palpitations     event monitor 03-2012-showed a single 6 beat run of NSVT; echo 05/25/12-EF55-60%, mild LVH  . Chest pain, atypical 2005    myoview 09/13/03-normal perfusion  . Vertigo 2005    nl carotid dopplers 08/09/2003    Past Surgical History  Procedure Laterality Date  . Anterior cervical decomp/discectomy fusion  07-29-2000    C4 - C7  . Cystoscopy w/ ureteroscopy  20 YRS AGO  (APPROX. 1990'S)  . Cystoscopy w/  retrogrades  12/25/2011    Procedure: CYSTOSCOPY WITH RETROGRADE PYELOGRAM;  Surgeon: Marcine MatarStephen Dahlstedt, MD;  [facility] ;  Service: Urology;  Laterality: Bilateral;     Current Outpatient Prescriptions  Medication Sig Dispense Refill  . ALPRAZolam (XANAX) 1 MG tablet Take 1 mg by mouth as needed for sleep.     Marland Kitchen. aspirin 81 MG tablet Take 81 mg by mouth daily.    . bisoprolol-hydrochlorothiazide (ZIAC) 5-6.25 MG per tablet Take 1 tablet by mouth every evening. 90 tablet 0  . CIALIS 5 MG tablet Take by mouth as needed.    Marland Kitchen. levothyroxine (SYNTHROID, LEVOTHROID) 50 MCG tablet Take 50 mcg by mouth daily before breakfast.    . lisinopril (PRINIVIL,ZESTRIL) 5 MG tablet Take 1 tablet (5 mg total) by mouth daily. 90 tablet 0  . VIAGRA 100 MG tablet Take 100 mg by mouth as needed.     No current facility-administered medications for this visit.    Allergies:   Review of patient's allergies indicates no known allergies.    Social History:  The patient  reports that he has never smoked. He has never used smokeless tobacco. He reports that he does not drink alcohol or use illicit drugs.   Family History:  The patient's family history includes CVA in his father; Healthy in his sister, son, and son; Heart attack in his brother  and mother.    ROS:  Please see the history of present illness.    The patient specifically denies any chest pain at rest or with exertion, dyspnea at rest or with exertion, orthopnea, paroxysmal nocturnal dyspnea, syncope, focal neurological deficits, intermittent claudication, lower extremity edema, unexplained weight gain, cough, hemoptysis or wheezing.  The patient also denies abdominal pain, nausea, vomiting, dysphagia, diarrhea, constipation, polyuria, polydipsia, dysuria, hematuria, frequency, urgency, abnormal bleeding or bruising, fever, chills, unexpected weight changes, mood swings, change in skin or hair texture, change in voice quality,  auditory or visual problems, allergic reactions or rashes, new musculoskeletal complaints other than usual "aches and pains".  All other systems are reviewed and negative.    PHYSICAL EXAM: VS:  BP 140/82 mmHg  Pulse 63  Resp 16  Ht  (1.778 m)  Wt 197 lb 6.4 oz (89.54 kg)  BMI 28.32 kg/m2 , BMI Body mass index is 28.32 kg/(m^2).  General: Alert, oriented x3, no distress Head: no evidence of trauma, PERRL, EOMI, no exophtalmos or lid lag, no myxedema, no xanthelasma; normal ears, nose and oropharynx Neck: normal jugular venous pulsations and no hepatojugular reflux; brisk carotid pulses without delay and no carotid bruits Chest: clear to auscultation, no signs of consolidation by percussion or palpation, normal fremitus, symmetrical and full respiratory excursions Cardiovascular: normal position and quality of the apical impulse, regular rhythm, normal first and second heart sounds, no murmurs, rubs or gallops Abdomen: no tenderness or distention, no masses by palpation, no abnormal pulsatility or arterial bruits, normal bowel sounds, no hepatosplenomegaly Extremities: no clubbing, cyanosis or edema; 2+ radial, ulnar and brachial pulses bilaterally; 2+ right femoral, posterior tibial and dorsalis pedis pulses; 2+ left femoral, posterior tibial and dorsalis pedis pulses; no subclavian or femoral bruits Neurological: grossly nonfocal Psych: euthymic mood, full affect  EKG:  EKG is ordered today. The ekg ordered today demonstrates NSR   Lipid Panel Total cholesterol 170, triglyceride 94, HDL 34, LDL 117 (2014)  Wt Readings from Last 3 Encounters:  09/28/14 197 lb 6.4 oz (89.54 kg)  05/28/14 199 lb 9.6 oz (90.538 kg)  09/25/13 205 lb 11.2 oz (93.305 kg)      ASSESSMENT AND PLAN:  Will check echo and treadmill stress test, get labs from PCP.   Current medicines are reviewed at length with the patient today.  The patient does not have concerns regarding medicines.  The  following changes have been made:  no change  Labs/ tests ordered today include:  Orders Placed This Encounter  Procedures  . EKG 12-Lead  . Exercise Tolerance Test  . 2D Echocardiogram without contrast    Patient Instructions  Your physician has requested that you have an echocardiogram. Echocardiography is a painless test that uses sound waves to create images of your heart. It provides your doctor with information about the size and shape of your heart and how well your heart's chambers and valves are working. This procedure takes approximately one hour. There are no restrictions for this procedure.   Your physician has requested that you have an exercise tolerance test. For further information please visit [email or web address] Please also follow instruction sheet, as given.   Dr. Royann Shivers recommends that you schedule a follow-up appointment in: One year.       Joie Bimler, MD  09/29/2014 7:27 PM    Thurmon Fair, MD, [facility] [phone] office [phone] pager

## 2014-10-23 ENCOUNTER — Telehealth (HOSPITAL_COMMUNITY): Payer: Self-pay

## 2014-10-23 NOTE — Telephone Encounter (Signed)
Encounter complete. 

## 2014-10-25 ENCOUNTER — Ambulatory Visit (HOSPITAL_COMMUNITY)
Admission: RE | Admit: 2014-10-25 | Discharge: 2014-10-25 | Disposition: A | Discharge status: Home / Self Care | Payer: 59 | Source: Ambulatory Visit | Attending: Cardiology | Admitting: Cardiology

## 2014-10-25 DIAGNOSIS — I472 Ventricular tachycardia: Secondary | ICD-10-CM | POA: Diagnosis not present

## 2014-10-25 DIAGNOSIS — R0602 Shortness of breath: Secondary | ICD-10-CM | POA: Diagnosis not present

## 2014-10-25 DIAGNOSIS — I350 Nonrheumatic aortic (valve) stenosis: Secondary | ICD-10-CM | POA: Diagnosis not present

## 2014-10-25 DIAGNOSIS — I4729 Other ventricular tachycardia: Secondary | ICD-10-CM

## 2014-10-25 NOTE — Progress Notes (Signed)
2D Echo Performed 10/25/2014    Anja Neuzil, RCS  

## 2014-10-26 NOTE — Procedures (Signed)
Exercise Treadmill Test  Test  Exercise Tolerance Test Ordering MD: Thurmon FairMihai Croitoru, MD    Unique Test No: 1  Treadmill:  1  Indication for ETT: Palpitations  Contraindication to ETT: No   Stress Modality: exercise - treadmill  Cardiac Imaging Performed: non   Protocol: standard Bruce - maximal  Max BP:  186/90  Max MPHR (bpm):  156 85% MPR (bpm):  133  MPHR obtained (bpm):  150 % MPHR obtained:  96  Reached 85% MPHR (min:sec):  7:15 Total Exercise Time (min-sec):  10  Workload in METS:  11.7 Borg Scale: 14  Reason ETT Terminated:  General Fatigue    ST Segment Analysis At Rest: normal ST segments - no evidence of significant ST depression With Exercise: no evidence of significant ST depression  Other Information Arrhythmia:  No Angina during ETT:  absent (0) Quality of ETT:  diagnostic  ETT Interpretation:  normal - no evidence of ischemia by ST analysis  Comments: The patient had an good exercise tolerance.  There was no chest pain.  There was an appropriate level of dyspnea.  There were no arrhythmias, a normal heart rate response and normal BP response.  There were no ischemic ST T wave changes and a normal heart rate recovery.  Recommendations: Further plans per Dr. Royann Shiversroitoru.

## 2014-10-29 LAB — EXERCISE TOLERANCE TEST
CHL CUP STRESS STAGE 1 SPEED: 0 mph
CHL CUP STRESS STAGE 2 HR: 75 {beats}/min
CHL CUP STRESS STAGE 3 GRADE: 0 %
CHL CUP STRESS STAGE 3 HR: 75 {beats}/min
CHL CUP STRESS STAGE 5 DBP: 78 mmHg
CHL CUP STRESS STAGE 5 GRADE: 12 %
CHL CUP STRESS STAGE 5 HR: 120 {beats}/min
CHL CUP STRESS STAGE 7 HR: 148 {beats}/min
CHL CUP STRESS STAGE 7 SPEED: 4.2 mph
CHL CUP STRESS STAGE 8 DBP: 90 mmHg
CHL CUP STRESS STAGE 8 GRADE: 0 %
CHL CUP STRESS STAGE 8 SPEED: 0 mph
CHL CUP STRESS STAGE 9 DBP: 94 mmHg
CHL CUP STRESS STAGE 9 SBP: 168 mmHg
CSEPPHR: 148 {beats}/min
Estimated workload: 11.7 METS
Percent of predicted max HR: 94 %
Stage 1 DBP: 96 mmHg
Stage 1 Grade: 0 %
Stage 1 HR: 74 {beats}/min
Stage 1 SBP: 127 mmHg
Stage 2 Grade: 0 %
Stage 2 Speed: 0 mph
Stage 3 Speed: 0.3 mph
Stage 4 DBP: 84 mmHg
Stage 4 Grade: 10 %
Stage 4 HR: 101 {beats}/min
Stage 4 SBP: 136 mmHg
Stage 4 Speed: 1.7 mph
Stage 5 SBP: 161 mmHg
Stage 5 Speed: 2.5 mph
Stage 6 DBP: 88 mmHg
Stage 6 Grade: 14 %
Stage 6 HR: 141 {beats}/min
Stage 6 SBP: 176 mmHg
Stage 6 Speed: 3.4 mph
Stage 7 Grade: 16 %
Stage 8 HR: 125 {beats}/min
Stage 8 SBP: 186 mmHg
Stage 9 Grade: 0 %
Stage 9 HR: 84 {beats}/min
Stage 9 Speed: 0 mph

## 2014-11-01 ENCOUNTER — Other Ambulatory Visit: Payer: Self-pay | Admitting: Cardiovascular Disease

## 2014-11-01 NOTE — Telephone Encounter (Signed)
Rx(s) sent to pharmacy electronically.  

## 2014-12-10 ENCOUNTER — Observation Stay (HOSPITAL_COMMUNITY)
Admission: EM | Admit: 2014-12-10 | Discharge: 2014-12-12 | Disposition: A | Discharge status: Home / Self Care | Payer: 59 | Attending: Internal Medicine | Admitting: Internal Medicine

## 2014-12-10 ENCOUNTER — Emergency Department (HOSPITAL_COMMUNITY): Payer: 59

## 2014-12-10 ENCOUNTER — Encounter (HOSPITAL_COMMUNITY): Payer: Self-pay

## 2014-12-10 DIAGNOSIS — G458 Other transient cerebral ischemic attacks and related syndromes: Secondary | ICD-10-CM | POA: Insufficient documentation

## 2014-12-10 DIAGNOSIS — R200 Anesthesia of skin: Secondary | ICD-10-CM | POA: Insufficient documentation

## 2014-12-10 DIAGNOSIS — I6782 Cerebral ischemia: Secondary | ICD-10-CM | POA: Insufficient documentation

## 2014-12-10 DIAGNOSIS — M4692 Unspecified inflammatory spondylopathy, cervical region: Secondary | ICD-10-CM | POA: Insufficient documentation

## 2014-12-10 DIAGNOSIS — E7800 Pure hypercholesterolemia, unspecified: Secondary | ICD-10-CM | POA: Diagnosis present

## 2014-12-10 DIAGNOSIS — Z7982 Long term (current) use of aspirin: Secondary | ICD-10-CM | POA: Diagnosis not present

## 2014-12-10 DIAGNOSIS — E039 Hypothyroidism, unspecified: Secondary | ICD-10-CM | POA: Diagnosis not present

## 2014-12-10 DIAGNOSIS — W57XXXA Bitten or stung by nonvenomous insect and other nonvenomous arthropods, initial encounter: Secondary | ICD-10-CM | POA: Diagnosis not present

## 2014-12-10 DIAGNOSIS — R202 Paresthesia of skin: Secondary | ICD-10-CM | POA: Insufficient documentation

## 2014-12-10 DIAGNOSIS — E785 Hyperlipidemia, unspecified: Secondary | ICD-10-CM | POA: Diagnosis not present

## 2014-12-10 DIAGNOSIS — G459 Transient cerebral ischemic attack, unspecified: Principal | ICD-10-CM

## 2014-12-10 DIAGNOSIS — R531 Weakness: Secondary | ICD-10-CM | POA: Diagnosis not present

## 2014-12-10 DIAGNOSIS — I10 Essential (primary) hypertension: Secondary | ICD-10-CM | POA: Diagnosis not present

## 2014-12-10 DIAGNOSIS — Z79899 Other long term (current) drug therapy: Secondary | ICD-10-CM | POA: Diagnosis not present

## 2014-12-10 DIAGNOSIS — Z981 Arthrodesis status: Secondary | ICD-10-CM | POA: Insufficient documentation

## 2014-12-10 LAB — DIFFERENTIAL
BASOS ABS: 0 10*3/uL (ref 0.0–0.1)
Basophils Relative: 0 % (ref 0–1)
Eosinophils Absolute: 0.2 10*3/uL (ref 0.0–0.7)
Eosinophils Relative: 2 % (ref 0–5)
Lymphocytes Relative: 30 % (ref 12–46)
Lymphs Abs: 2.4 10*3/uL (ref 0.7–4.0)
MONO ABS: 1.2 10*3/uL — AB (ref 0.1–1.0)
Monocytes Relative: 15 % — ABNORMAL HIGH (ref 3–12)
NEUTROS ABS: 4.2 10*3/uL (ref 1.7–7.7)
NEUTROS PCT: 53 % (ref 43–77)

## 2014-12-10 LAB — COMPREHENSIVE METABOLIC PANEL
ALT: 16 U/L — AB (ref 17–63)
AST: 22 U/L (ref 15–41)
Albumin: 4.2 g/dL (ref 3.5–5.0)
Alkaline Phosphatase: 50 U/L (ref 38–126)
Anion gap: 6 (ref 5–15)
BUN: 19 mg/dL (ref 6–20)
CO2: 29 mmol/L (ref 22–32)
Calcium: 8.8 mg/dL — ABNORMAL LOW (ref 8.9–10.3)
Chloride: 103 mmol/L (ref 101–111)
Creatinine, Ser: 1.23 mg/dL (ref 0.61–1.24)
GFR calc non Af Amer: >60 mL/min (ref >60)
GLUCOSE: 89 mg/dL (ref 65–99)
Potassium: 3.8 mmol/L (ref 3.5–5.1)
SODIUM: 138 mmol/L (ref 135–145)
Total Bilirubin: 0.7 mg/dL (ref 0.3–1.2)
Total Protein: 7.1 g/dL (ref 6.5–8.1)

## 2014-12-10 LAB — CBC
HEMATOCRIT: 44.4 % (ref 39.0–52.0)
HEMOGLOBIN: 15.2 g/dL (ref 13.0–17.0)
MCH: 29.6 pg (ref 26.0–34.0)
MCHC: 34.2 g/dL (ref 30.0–36.0)
MCV: 86.5 fL (ref 78.0–100.0)
Platelets: 259 10*3/uL (ref 150–400)
RBC: 5.13 MIL/uL (ref 4.22–5.81)
RDW: 13 % (ref 11.5–15.5)
WBC: 8 10*3/uL (ref 4.0–10.5)

## 2014-12-10 LAB — I-STAT TROPONIN, ED: Troponin i, poc: 0 ng/mL (ref 0.00–0.08)

## 2014-12-10 LAB — URINALYSIS, ROUTINE W REFLEX MICROSCOPIC
Bilirubin Urine: NEGATIVE
Glucose, UA: NEGATIVE mg/dL
Hgb urine dipstick: NEGATIVE
Ketones, ur: NEGATIVE mg/dL
LEUKOCYTES UA: NEGATIVE
NITRITE: NEGATIVE
PH: 6.5 (ref 5.0–8.0)
Protein, ur: NEGATIVE mg/dL
Specific Gravity, Urine: 1.006 (ref 1.005–1.030)
UROBILINOGEN UA: 0.2 mg/dL (ref 0.0–1.0)

## 2014-12-10 LAB — ETHANOL: Alcohol, Ethyl (B): <5 mg/dL (ref <5)

## 2014-12-10 LAB — APTT: APTT: 29 s (ref 24–37)

## 2014-12-10 LAB — PROTIME-INR
INR: 1.12 (ref 0.00–1.49)
Prothrombin Time: 14.6 seconds (ref 11.6–15.2)

## 2014-12-10 NOTE — H&P (Signed)
[facility] History and Physical  [name]:[id number] DOB: December 28, 1950 DOA: 12/10/2014  Referring physician: Arby Barrette, MD PCP: Kirk Ruths, MD   Chief Complaint: TIA  HPI: Derrick [name] is a 64 y.o. male with prior history of HTN Hyperlipidemia hypothyroidism presents with numbness and tingling.Patient states that he noted tingling on the left side of his face down his side and in his arm. Patient states that usually it is self limited and goes away on its own. He states that whenever he stands up the feeling seems to come on. Patient states that when he sits down or lays down it improves. He states that xanax also seems to help. He has had no headaches noted. He has had no visual changes. His voice has been garbled at times. Today he did not have the voice changes.  He states that he has noted that his left ear seems to ring louder when he has this episode. Patient states he feels as though he has gone to dentist and has had novocaine. Patient has been taking cialis and viagra but does not correlate the events with these drugs. Patient states that he recently had his thyroid medications change. Patient has had a history of disc surgery on the neck. Incidentally a Tick was found on his person by the ED and removed alive. Patient does work outdoors frequently. He does not smoke or drink. Recent stress test was negative   Review of Systems:  Constitutional:  No weight loss, night sweats, Fevers, chills, fatigue.  HEENT:  No headaches, Difficulty swallowing,itching, ear ache, nasal congestion, post nasal drip,  Cardio-vascular:  No chest pain, PND, swelling in lower extremities, anasarca, +dizziness, palpitations  GI:  No heartburn, indigestion, abdominal pain, nausea, vomiting, diarrhea Resp:  No shortness of breath with exertion or at rest. No coughing up of blood.No change in color of mucus Skin:  Tick bite mark GU:  no dysuria, change in color of urine, no urgency    Musculoskeletal:  No joint pain or swelling. No decreased range of motion. No back pain.  Psych:  No change in mood or affect. No depression or anxiety   Past Medical History  Diagnosis Date  . Hematuria   . Arthritis NECK  . S/P cervical spinal fusion   . Hypertension C[facility]IOLOGIST- [facility]    STRESS TEST YRS AGO-- OK  ; LAST VISIT 1 MONTH AGO  . Hypothyroidism   . History of prostatitis   . ED (erectile dysfunction)     intolerant to viagra and cialis  . Hyperlipidemia   . Palpitations     event monitor 03-2012-showed a single 6 beat run of NSVT; echo 05/25/12-EF55-60%, mild LVH  . Chest pain, atypical 2005    myoview 09/13/03-normal perfusion  . Vertigo 2005    nl carotid dopplers 08/09/2003   Past Surgical History  Procedure Laterality Date  . Anterior cervical decomp/discectomy fusion  07-29-2000    C4 - C7  . Cystoscopy w/ ureteroscopy  20 YRS AGO  (APPROX. 1990'S)  . Cystoscopy w/ retrogrades  12/25/2011    Procedure: CYSTOSCOPY WITH RETROGRADE PYELOGRAM;  Surgeon: Marcine Matar, MD;  Location: [facility];  Service: Urology;  Laterality: Bilateral;   Social History:  reports that he has never smoked. He has never used smokeless tobacco. He reports that he does not drink alcohol or use illicit drugs.  No Known Allergies  Family History  Problem Relation Age of Onset  . CVA Father   .  Healthy Sister   . Healthy Son   . Healthy Son   . Heart attack Mother   . Heart attack Brother      Prior to Admission medications   Medication Sig Start Date End Date Taking? Authorizing Provider  ALPRAZolam Prudy Feeler) 1 MG tablet Take 1 mg by mouth as needed for sleep.  05/09/14  Yes Historical Provider, MD  aspirin EC 81 MG tablet Take 81 mg by mouth daily.   Yes Historical Provider, MD  bisoprolol-hydrochlorothiazide (ZIAC) 5-6.25 MG per tablet Take 1 tablet by mouth every evening. 07/17/14  Yes Mihai Croitoru, MD  CIALIS 5 MG tablet Take by mouth as  needed. 09/04/14  Yes Historical Provider, MD  levothyroxine (SYNTHROID, LEVOTHROID) 50 MCG tablet Take 50 mcg by mouth daily before breakfast.   Yes Historical Provider, MD  lisinopril (PRINIVIL,ZESTRIL) 5 MG tablet TAKE 1 TABLET (5 MG TOTAL) BY MOUTH DAILY. 11/01/14  Yes Mihai Croitoru, MD  VIAGRA 100 MG tablet Take 100 mg by mouth as needed. 09/18/13  Yes Historical Provider, MD   Physical Exam: Filed Vitals:   12/10/14 2100 12/10/14 2130 12/10/14 2200 12/10/14 2230  BP: 133/89 133/76 138/76 126/78  Pulse: 57 58 54 71  Temp:      TempSrc:      Resp: SpO2: 99% 99% 100% 82%    Wt Readings from Last 3 Encounters:  09/28/14 89.54 kg (197 lb 6.4 oz)  05/28/14 90.538 kg (199 lb 9.6 oz)  09/25/13 93.305 kg (205 lb 11.2 oz)    General:  Appears calm and comfortable Eyes: PERRL, normal lids, irises & conjunctiva ENT: grossly normal hearing, lips & tongue Neck: no LAD, masses or thyromegaly Cardiovascular: RRR, no m/r/g. No LE edema. Telemetry: SR, no arrhythmias  Respiratory: CTA bilaterally, no w/r/r. Normal respiratory effort. Abdomen: soft, ntnd Skin: tick bite site noted possibly a second on lower back Musculoskeletal: grossly normal tone BUE/BLE Psychiatric: grossly normal mood and affect, speech fluent and appropriate Neurologic: grossly non-focal equal preserved strength in all 4 extremities          Labs on Admission:  Basic Metabolic Panel:  Recent Labs Lab 12/10/14 2037  NA 138  K 3.8  CL 103  CO2 29  GLUCOSE 89  BUN 19  CREATININE 1.23  CALCIUM 8.8*   Liver Function Tests:  Recent Labs Lab 12/10/14 2037  AST 22  ALT 16*  ALKPHOS 50  BILITOT 0.7  PROT 7.1  ALBUMIN 4.2   No results for input(s): LIPASE, AMYLASE in the last 168 hours. No results for input(s): AMMONIA in the last 168 hours. CBC:  Recent Labs Lab 12/10/14 2037  WBC 8.0  NEUTROABS 4.2  HGB 15.2  HCT 44.4  MCV 86.5  PLT 259   Cardiac Enzymes: No results for  input(s): CKTOTAL, CKMB, CKMBINDEX, TROPONINI in the last 168 hours.  BNP (last 3 results) No results for input(s): BNP in the last 8760 hours.  ProBNP (last 3 results) No results for input(s): PROBNP in the last 8760 hours.  CBG: No results for input(s): GLUCAP in the last 168 hours.  Radiological Exams on Admission: Ct Head Wo Contrast  12/10/2014   CLINICAL DATA:  Ringing in the left ear, and left-sided numbness for 2 weeks. Initial encounter.  EXAM: CT HEAD WITHOUT CONTRAST  TECHNIQUE: Contiguous axial images were obtained from the base of the skull through the vertex without intravenous contrast.  COMPARISON:  MRI of the brain performed 04/06/2013  FINDINGS: There is no evidence of acute infarction, mass lesion, or intra- or extra-axial hemorrhage on CT.  Prominence of the sulci suggests mild cortical volume loss. Mild periventricular white matter change likely reflects small vessel ischemic microangiopathy.  The brainstem and fourth ventricle are within normal limits. The basal ganglia are unremarkable in appearance. The cerebral hemispheres demonstrate grossly normal gray-white differentiation. No mass effect or midline shift is seen.  There is no evidence of fracture; visualized osseous structures are unremarkable in appearance. The visualized portions of the orbits are within normal limits. The paranasal sinuses and mastoid air cells are well-aerated. No significant soft tissue abnormalities are seen.  IMPRESSION: 1. No acute intracranial pathology seen on CT. 2. Mild small vessel ischemic microangiopathy and likely mild cortical volume loss.   Electronically Signed   By: Roanna Raider M.D.   On: 12/10/2014 20:52      Assessment/Plan Principal Problem:   TIA (transient ischemic attack) Active Problems:   Hypothyroidism   Essential hypertension, benign   Hypercholesterolemia   1. TIA -patient will be admitted to telemetry at [facility] per neurology request -will get carotid Doppler  and echo -will order MRI MRA -will get A1C monitor CBG -frequent neurochecks -PT/OT evaluation -Aspirin daily -neurology to see patient at [facility] -should avoid cialis and viagra for now  2. HTN -continue with antihypertensives -monitor pressures  3. Hyperlipidemia -will continue with statins  4. Hypothyroidism -will check TSH  5. Tick Bite -will start on empiric doxycyline -will get lyme and RMSF titers   Code Status: Full Code (must indicate code status--if unknown or must be presumed, indicate so) DVT Prophylaxis:Heparin Family Communication: Wife (indicate person spoken with, if applicable, with phone number if by telephone) Disposition Plan: Home (indicate anticipated LOS)  Time spent:  [facility] Pager [phone]

## 2014-12-10 NOTE — ED Notes (Signed)
Pt presents with c/o numbness that extends from the left side of his face down the left side of his body. Pt reports his thyroid medication was recently changed and he started experiencing these symptoms after that. Pt reports the symptoms have been been resolving on their own but today they have not resolved. Pt's neuro status intact at this time, no aphasia, no c/o dizziness.

## 2014-12-10 NOTE — ED Provider Notes (Signed)
CSN: [id number]     Arrival date & time 12/10/14  1936 History   First MD Initiated Contact with Patient 12/10/14 1956     Chief Complaint  Patient presents with  . Numbness     (Consider location/radiation/quality/duration/timing/severity/associated sxs/prior Treatment) HPI over the past week patient has had intermittent episodes of left sided numbness. This has come and gone several times. He reports complete resolution in between episodes. Her port yesterday he was in his car and he had a episode whereby his left arm and leg felt heavy and difficult to move. He waited and it eventually resolved. Day he again had several episodes of numbness on the left side. Today he did not have weakness associated. No associated headache, fever or general illness. The patient reports he does have a strong family history of stroke.  Past Medical History  Diagnosis Date  . Hematuria   . Arthritis NECK  . S/P cervical spinal fusion   . Hypertension CARDIOLOGIST- [facility]    STRESS TEST YRS AGO-- OK  ; LAST VISIT 1 MONTH AGO  . Hypothyroidism   . History of prostatitis   . ED (erectile dysfunction)     intolerant to viagra and cialis  . Hyperlipidemia   . Palpitations     event monitor 03-2012-showed a single 6 beat run of NSVT; echo 05/25/12-EF55-60%, mild LVH  . Chest pain, atypical 2005    myoview 09/13/03-normal perfusion  . Vertigo 2005    nl carotid dopplers 08/09/2003   Past Surgical History  Procedure Laterality Date  . Anterior cervical decomp/discectomy fusion  07-29-2000    C4 - C7  . Cystoscopy w/ ureteroscopy  20 YRS AGO  (APPROX. 1990'S)  . Cystoscopy w/ retrogrades  12/25/2011    Procedure: CYSTOSCOPY WITH RETROGRADE PYELOGRAM;  Surgeon: Marcine Matar, MD;  Location: [facility];  Service: Urology;  Laterality: Bilateral;   Family History  Problem Relation Age of Onset  . CVA Father   . Healthy Sister   . Healthy Son   . Healthy Son   . Heart  attack Mother   . Heart attack Brother    History  Substance Use Topics  . Smoking status: Never Smoker   . Smokeless tobacco: Never Used  . Alcohol Use: No    Review of Systems 10 Systems reviewed and are negative for acute change except as noted in the HPI.    Allergies  Review of patient's allergies indicates no known allergies.  Home Medications   Prior to Admission medications   Medication Sig Start Date End Date Taking? Authorizing Provider  ALPRAZolam Prudy Feeler) 1 MG tablet Take 1 mg by mouth as needed for sleep.  05/09/14  Yes Historical Provider, MD  aspirin EC 81 MG tablet Take 81 mg by mouth daily.   Yes Historical Provider, MD  bisoprolol-hydrochlorothiazide (ZIAC) 5-6.25 MG per tablet Take 1 tablet by mouth every evening. 07/17/14  Yes Mihai Croitoru, MD  CIALIS 5 MG tablet Take by mouth as needed. 09/04/14  Yes Historical Provider, MD  levothyroxine (SYNTHROID, LEVOTHROID) 50 MCG tablet Take 50 mcg by mouth daily before breakfast.   Yes Historical Provider, MD  lisinopril (PRINIVIL,ZESTRIL) 5 MG tablet TAKE 1 TABLET (5 MG TOTAL) BY MOUTH DAILY. 11/01/14  Yes Mihai Croitoru, MD  VIAGRA 100 MG tablet Take 100 mg by mouth as needed. 09/18/13  Yes Historical Provider, MD   BP 126/78 mmHg  Pulse 71  Temp(Src) 98 F (36.7 C) (Oral)  Resp 18  SpO2 82% Physical Exam  Constitutional: He is oriented to person, place, and time. He appears well-developed and well-nourished.  HENT:  Head: Normocephalic and atraumatic.  Nose: Nose normal.  Mouth/Throat: Oropharynx is clear and moist.  Eyes: EOM are normal. Pupils are equal, round, and reactive to light.  Neck: Neck supple.  Cardiovascular: Normal rate, regular rhythm, normal heart sounds and intact distal pulses.   Pulmonary/Chest: Effort normal and breath sounds normal.  Abdominal: Soft. Bowel sounds are normal. He exhibits no distension. There is no tenderness.  Musculoskeletal: Normal range of motion. He exhibits no edema.    Neurological: He is alert and oriented to person, place, and time. He has normal strength. No cranial nerve deficit. He exhibits normal muscle tone. Coordination normal. GCS eye subscore is 4. GCS verbal subscore is 5. GCS motor subscore is 6.  5 out of 5 upper and lower extremity strength to flexion and extension. Sensation is intact to light touch 4. Cranial nerves normal.  Skin: Skin is warm, dry and intact.  Patient did have a tick attached an embedded on the left shoulder. No surrounding erythema. There was a small erythematous nodule in the center the patient's back. No associated pustule or vesicle. No surrounding target rash.  Psychiatric: He has a normal mood and affect.        ED Course  Procedures (including critical care time) Labs Review Labs Reviewed  DIFFERENTIAL - Abnormal; Notable for the following:    Monocytes Relative 15 (*)    Monocytes Absolute 1.2 (*)    All other components within normal limits  COMPREHENSIVE METABOLIC PANEL - Abnormal; Notable for the following:    Calcium 8.8 (*)    ALT 16 (*)    All other components within normal limits  ETHANOL  PROTIME-INR  APTT  CBC  URINALYSIS, ROUTINE W REFLEX MICROSCOPIC (NOT AT [facility])  I-STAT CHEM 8, ED  I-STAT TROPOININ, ED    Imaging Review Ct Head Wo Contrast  12/10/2014   CLINICAL DATA:  Ringing in the left ear, and left-sided numbness for 2 weeks. Initial encounter.  EXAM: CT HEAD WITHOUT CONTRAST  TECHNIQUE: Contiguous axial images were obtained from the base of the skull through the vertex without intravenous contrast.  COMPARISON:  MRI of the brain performed 04/06/2013  FINDINGS: There is no evidence of acute infarction, mass lesion, or intra- or extra-axial hemorrhage on CT.  Prominence of the sulci suggests mild cortical volume loss. Mild periventricular white matter change likely reflects small vessel ischemic microangiopathy.  The brainstem and fourth ventricle are within normal limits. The basal  ganglia are unremarkable in appearance. The cerebral hemispheres demonstrate grossly normal gray-white differentiation. No mass effect or midline shift is seen.  There is no evidence of fracture; visualized osseous structures are unremarkable in appearance. The visualized portions of the orbits are within normal limits. The paranasal sinuses and mastoid air cells are well-aerated. No significant soft tissue abnormalities are seen.  IMPRESSION: 1. No acute intracranial pathology seen on CT. 2. Mild small vessel ischemic microangiopathy and likely mild cortical volume loss.   Electronically Signed   By: Roanna Raider M.D.   On: 12/10/2014 20:52     EKG Interpretation None     Consult: 20:55 patient's case was reviewed with Dr. Hosie Poisson. He advises for admission and transferred to [facility]. MDM   Final diagnoses:  Paresthesia  Weakness of left side of body  Wood tick bite  Other specified transient cerebral ischemias   The patient  presents is on above. At this point time he does not have constitutional symptoms to suggest zoonotic infection. Symptoms preceded the tick bite and are unlikely to represent complications of infection or myelitis. The tick was not engorged and is unlikely to get on the patient for more than a day. Patient will be admitted as per discussion with Dr. Hosie Poisson for further evaluation of stroke\TIA.    Arby Barrette, MD 12/10/14 2312

## 2014-12-11 ENCOUNTER — Observation Stay (HOSPITAL_COMMUNITY): Payer: 59

## 2014-12-11 ENCOUNTER — Inpatient Hospital Stay (HOSPITAL_COMMUNITY): Payer: 59

## 2014-12-11 ENCOUNTER — Observation Stay (HOSPITAL_BASED_OUTPATIENT_CLINIC_OR_DEPARTMENT_OTHER): Payer: 59

## 2014-12-11 DIAGNOSIS — E038 Other specified hypothyroidism: Secondary | ICD-10-CM | POA: Diagnosis not present

## 2014-12-11 DIAGNOSIS — E780 Pure hypercholesterolemia: Secondary | ICD-10-CM

## 2014-12-11 DIAGNOSIS — I10 Essential (primary) hypertension: Secondary | ICD-10-CM

## 2014-12-11 DIAGNOSIS — G458 Other transient cerebral ischemic attacks and related syndromes: Secondary | ICD-10-CM | POA: Diagnosis not present

## 2014-12-11 DIAGNOSIS — E039 Hypothyroidism, unspecified: Secondary | ICD-10-CM

## 2014-12-11 DIAGNOSIS — R202 Paresthesia of skin: Secondary | ICD-10-CM | POA: Insufficient documentation

## 2014-12-11 DIAGNOSIS — G459 Transient cerebral ischemic attack, unspecified: Secondary | ICD-10-CM

## 2014-12-11 DIAGNOSIS — G451 Carotid artery syndrome (hemispheric): Secondary | ICD-10-CM | POA: Diagnosis not present

## 2014-12-11 DIAGNOSIS — R200 Anesthesia of skin: Secondary | ICD-10-CM | POA: Insufficient documentation

## 2014-12-11 LAB — VITAMIN B12: Vitamin B-12: 209 pg/mL (ref 180–914)

## 2014-12-11 LAB — CREATININE, SERUM
Creatinine, Ser: 1.19 mg/dL (ref 0.61–1.24)
GFR calc non Af Amer: >60 mL/min (ref >60)

## 2014-12-11 LAB — CBC
HCT: 43.1 % (ref 39.0–52.0)
Hemoglobin: 15.1 g/dL (ref 13.0–17.0)
MCH: 29.6 pg (ref 26.0–34.0)
MCHC: 35 g/dL (ref 30.0–36.0)
MCV: 84.5 fL (ref 78.0–100.0)
PLATELETS: 233 10*3/uL (ref 150–400)
RBC: 5.1 MIL/uL (ref 4.22–5.81)
RDW: 13 % (ref 11.5–15.5)
WBC: 7.7 10*3/uL (ref 4.0–10.5)

## 2014-12-11 LAB — RAPID URINE DRUG SCREEN, HOSP PERFORMED
Amphetamines: NOT DETECTED
BENZODIAZEPINES: POSITIVE — AB
Barbiturates: NOT DETECTED
Cocaine: NOT DETECTED
Opiates: NOT DETECTED
Tetrahydrocannabinol: NOT DETECTED

## 2014-12-11 LAB — LIPID PANEL
CHOL/HDL RATIO: 6.1 ratio
CHOLESTEROL: 177 mg/dL (ref 0–200)
HDL: 29 mg/dL — ABNORMAL LOW (ref >40)
LDL Cholesterol: 121 mg/dL — ABNORMAL HIGH (ref 0–99)
Triglycerides: 135 mg/dL (ref <150)
VLDL: 27 mg/dL (ref 0–40)

## 2014-12-11 LAB — GLUCOSE, CAPILLARY
GLUCOSE-CAPILLARY: 116 mg/dL — AB (ref 65–99)
GLUCOSE-CAPILLARY: 93 mg/dL (ref 65–99)
Glucose-Capillary: 97 mg/dL (ref 65–99)
Glucose-Capillary: 96 mg/dL (ref 65–99)

## 2014-12-11 LAB — I-STAT CHEM 8, ED
BUN: 19 mg/dL (ref 6–20)
CHLORIDE: 101 mmol/L (ref 101–111)
Calcium, Ion: 1.16 mmol/L (ref 1.13–1.30)
Creatinine, Ser: 1.3 mg/dL — ABNORMAL HIGH (ref 0.61–1.24)
GLUCOSE: 87 mg/dL (ref 65–99)
HCT: 46 % (ref 39.0–52.0)
HEMOGLOBIN: 15.6 g/dL (ref 13.0–17.0)
Potassium: 3.9 mmol/L (ref 3.5–5.1)
Sodium: 142 mmol/L (ref 135–145)
TCO2: 26 mmol/L (ref 0–100)

## 2014-12-11 LAB — T4, FREE: FREE T4: 0.94 ng/dL (ref 0.61–1.12)

## 2014-12-11 MED ORDER — ASPIRIN EC 81 MG PO TBEC: 81.0000 mg | DELAYED_RELEASE_TABLET | Freq: Every day | ORAL | Status: DC

## 2014-12-11 MED ORDER — CLOPIDOGREL BISULFATE 75 MG PO TABS
75.0000 mg | ORAL_TABLET | Freq: Every day | ORAL | Status: DC
  Administered 2014-12-12: 75 mg via ORAL
  Filled 2014-12-11: qty 1

## 2014-12-11 MED ORDER — LISINOPRIL 5 MG PO TABS
5.0000 mg | ORAL_TABLET | Freq: Every day | ORAL | Status: DC
  Administered 2014-12-11 – 2014-12-12 (×2): 5 mg via ORAL
  Filled 2014-12-11 (×2): qty 1

## 2014-12-11 MED ORDER — STROKE: EARLY STAGES OF RECOVERY BOOK
Freq: Once | Status: AC
  Administered 2014-12-11: 01:00:00
  Filled 2014-12-11: qty 1

## 2014-12-11 MED ORDER — LEVOTHYROXINE SODIUM 50 MCG PO TABS
50.0000 ug | ORAL_TABLET | Freq: Every day | ORAL | Status: DC
  Administered 2014-12-11 – 2014-12-12 (×2): 50 ug via ORAL
  Filled 2014-12-11 (×3): qty 1

## 2014-12-11 MED ORDER — SIMVASTATIN 40 MG PO TABS
40.0000 mg | ORAL_TABLET | Freq: Every day | ORAL | Status: DC
  Administered 2014-12-11: 40 mg via ORAL
  Filled 2014-12-11 (×2): qty 1

## 2014-12-11 MED ORDER — SODIUM CHLORIDE 0.9 % IV SOLN
INTRAVENOUS | Status: DC
  Administered 2014-12-11 (×2): via INTRAVENOUS

## 2014-12-11 MED ORDER — ACETAMINOPHEN 325 MG PO TABS: 650.0000 mg | ORAL_TABLET | ORAL | Status: DC | PRN

## 2014-12-11 MED ORDER — CLOPIDOGREL BISULFATE 300 MG PO TABS
300.0000 mg | ORAL_TABLET | Freq: Once | ORAL | Status: AC
  Administered 2014-12-11: 300 mg via ORAL
  Filled 2014-12-11: qty 1

## 2014-12-11 MED ORDER — DOXYCYCLINE HYCLATE 100 MG PO TABS
200.0000 mg | ORAL_TABLET | Freq: Once | ORAL | Status: AC
  Administered 2014-12-11: 200 mg via ORAL
  Filled 2014-12-11: qty 2

## 2014-12-11 MED ORDER — HEPARIN SODIUM (PORCINE) 5000 UNIT/ML IJ SOLN
5000.0000 [IU] | Freq: Three times a day (TID) | INTRAMUSCULAR | Status: DC
  Administered 2014-12-11 – 2014-12-12 (×4): 5000 [IU] via SUBCUTANEOUS
  Filled 2014-12-11 (×6): qty 1

## 2014-12-11 MED ORDER — ASPIRIN 325 MG PO TABS
325.0000 mg | ORAL_TABLET | Freq: Every day | ORAL | Status: DC
  Administered 2014-12-11: 325 mg via ORAL
  Filled 2014-12-11: qty 1

## 2014-12-11 MED ORDER — ASPIRIN EC 325 MG PO TBEC
325.0000 mg | DELAYED_RELEASE_TABLET | Freq: Every day | ORAL | Status: DC
  Administered 2014-12-12: 325 mg via ORAL
  Filled 2014-12-11: qty 1

## 2014-12-11 MED ORDER — ALPRAZOLAM 0.5 MG PO TABS
1.0000 mg | ORAL_TABLET | ORAL | Status: DC | PRN
  Administered 2014-12-11: 1 mg via ORAL
  Filled 2014-12-11: qty 2

## 2014-12-11 MED ORDER — DOXYCYCLINE HYCLATE 100 MG PO TABS
200.0000 mg | ORAL_TABLET | Freq: Two times a day (BID) | ORAL | Status: DC
  Filled 2014-12-11 (×2): qty 2

## 2014-12-11 MED ORDER — DOXYCYCLINE HYCLATE 100 MG PO TABS
100.0000 mg | ORAL_TABLET | Freq: Two times a day (BID) | ORAL | Status: DC
  Administered 2014-12-11: 100 mg via ORAL
  Filled 2014-12-11 (×2): qty 1

## 2014-12-11 MED ORDER — BISOPROLOL-HYDROCHLOROTHIAZIDE 5-6.25 MG PO TABS
1.0000 | ORAL_TABLET | Freq: Every evening | ORAL | Status: DC
  Administered 2014-12-11: 1 via ORAL
  Filled 2014-12-11 (×2): qty 1

## 2014-12-11 MED ORDER — TOPIRAMATE 25 MG PO TABS
25.0000 mg | ORAL_TABLET | Freq: Two times a day (BID) | ORAL | Status: DC
  Administered 2014-12-11 – 2014-12-12 (×3): 25 mg via ORAL
  Filled 2014-12-11 (×4): qty 1

## 2014-12-11 NOTE — Progress Notes (Signed)
*  PRELIMINARY RESULTS* Vascular Ultrasound Carotid Duplex (Doppler) has been completed.  Findings suggest 1-39% internal carotid artery stenosis bilaterally. Vertebral arteries are patent with antegrade flow.  12/11/2014 12:59 PM Gertie Fey, RVT, RDCS, RDMS

## 2014-12-11 NOTE — Progress Notes (Signed)
Return call from Dr. Arbutus Leas,  no new orders given.  Will continue to monitor.  Forbes Cellar, RN

## 2014-12-11 NOTE — Progress Notes (Addendum)
PROGRESS NOTE  Derrick [name] VHQ:[id number] DOB: 1951-06-06 DOA: 12/10/2014 PCP: Kirk Ruths, MD Brief History 64 year old male with a history of hypertension presents with 1-2 week history of intermittent dysesthesias manifesting as numbness and tingling extending from his left face, down to his left arm down to his left side of the trunk, down to his left lower extremity to his toes. he states that the episodes only last 1-2 minutes and then spontaneously resolved. However on 12/09/2014, his episodes of dysesthesia lasted longer. He took some Xanax which appeared to help it. However, after he woke up the dysesthesias continued. As a result, he came to emergency department for further evaluation. Notably, the patient goes biking for 8 miles daily. If he does not bike, he walks 2-4 miles daily. He has not had any worsening decrease of his stamina. The patient had an exercise stress test performed 10/25/2014 which was normal.    Assessment/Plan:  left-sided dysesthesias  -MRI brain negative for acute infarct  -MRA brain negative  -MRI cervical spine negative for any significant spinal stenosis or foraminal narrowing except for mild foraminal narrowing C3-4  -suspect that the patient may have anxiety component -Check serum B12, RBC folate, TSH, RPR, HIV, hemoglobin A1c -Carotid duplex negative -Appreciate neurology follow-up  -Continue aspirin 325 mg daily per neurology  -PT/OT/ST Dyslipidemia  -LDL 121  -Start Zocor 40 mg daily   hypertension -Continue lisinopril, ZIAC Tick Bite -pt and wife felt it was "deer tick"--removed in ED -give one time dose doxycycline  per IDSA recommendation -no signs of erythema migrans on examination Hypothyroidism  -Continue Synthroid   Family Communication:   Pt at beside Disposition Plan:   Home 12/12/2014 if cleared by neurology         Procedures/Studies: Ct Head Wo Contrast  12/10/2014   CLINICAL DATA:  Ringing in the  left ear, and left-sided numbness for 2 weeks. Initial encounter.  EXAM: CT HEAD WITHOUT CONTRAST  TECHNIQUE: Contiguous axial images were obtained from the base of the skull through the vertex without intravenous contrast.  COMPARISON:  MRI of the brain performed 04/06/2013  FINDINGS: There is no evidence of acute infarction, mass lesion, or intra- or extra-axial hemorrhage on CT.  Prominence of the sulci suggests mild cortical volume loss. Mild periventricular white matter change likely reflects small vessel ischemic microangiopathy.  The brainstem and fourth ventricle are within normal limits. The basal ganglia are unremarkable in appearance. The cerebral hemispheres demonstrate grossly normal gray-white differentiation. No mass effect or midline shift is seen.  There is no evidence of fracture; visualized osseous structures are unremarkable in appearance. The visualized portions of the orbits are within normal limits. The paranasal sinuses and mastoid air cells are well-aerated. No significant soft tissue abnormalities are seen.  IMPRESSION: 1. No acute intracranial pathology seen on CT. 2. Mild small vessel ischemic microangiopathy and likely mild cortical volume loss.   Electronically Signed   By: Roanna Raider M.D.   On: 12/10/2014 20:52   Mri Brain Without Contrast  12/11/2014   CLINICAL DATA:  Initial evaluation for recurrent left facial, arm, leg paresthesias.  EXAM: MRI HEAD WITHOUT CONTRAST  MRA HEAD WITHOUT CONTRAST  TECHNIQUE: Multiplanar, multiecho pulse sequences of the brain and surrounding structures were obtained without intravenous contrast. Angiographic images of the head were obtained using MRA technique without contrast.  COMPARISON:  Prior CT from 12/10/2014 as well as MRI from 04/06/2013  FINDINGS: MRI HEAD  FINDINGS  Mild diffuse prominence of the CSF containing spaces is compatible with generalized cerebral atrophy. Very mild chronic small vessel ischemic changes present within the  periventricular and deep white matter. Probable small remote bilateral cerebellar infarcts noted.  No abnormal foci of restricted diffusion to suggest acute intracranial infarct. Gray-white matter differentiation maintained. Normal intravascular flow voids are preserved. No acute or chronic intracranial hemorrhage.  No mass lesion or midline shift. No mass effect. Ventricles are normal size without evidence of hydrocephalus. No extra-axial fluid collection.  Craniocervical junction within normal limits. Mild degenerative changes present within the visualized upper cervical spine. Pituitary gland within normal limits. No acute abnormality about the orbits.  Mild polypoid opacity present within the maxillary sinuses. Paranasal sinuses are otherwise clear. Scattered fluid opacity present within the left mastoid air cells. Right mastoid air cells clear. Inner ear structures normal.  Bone marrow signal intensity within normal limits. No scalp soft tissue abnormality.  MRA HEAD FINDINGS  ANTERIOR CIRCULATION:  Visualized portions of the distal cervical segments of the internal carotid arteries are widely patent with antegrade flow. The petrous, cavernous, and supra clinoid segments are widely patent. A1 segments, anterior communicating artery, and anterior cerebral arteries well opacified. M1 segments widely patent without stenosis or occlusion. Distal MCA branches within normal limits.  POSTERIOR CIRCULATION:  Vertebral arteries are widely patent to the vertebrobasilar junction. Posterior inferior cerebral arteries opacified bilaterally. Basilar artery widely patent. Superior cerebellar arteries well opacified. There appears to be fetal origin of the PCAs bilaterally. PCAs themselves are widely patent.  No aneurysm or vascular malformation.  IMPRESSION: MRI HEAD IMPRESSION:  1. No acute intracranial infarct or other process identified. 2. Mild generalized cerebral atrophy with chronic small vessel ischemic disease.  MRA  HEAD IMPRESSION:  Normal MRA of the intracranial circulation.   Electronically Signed   By: Rise Mu M.D.   On: 12/11/2014 04:16   Mr Cervical Spine W Wo Contrast  12/11/2014   CLINICAL DATA:  64 year old hypertensive male with this is the series of the left arm. Prior cervical fusion. Subsequent encounter.  EXAM: MRI CERVICAL SPINE WITHOUT CONTRAST  TECHNIQUE: Multiplanar, multisequence MR imaging of the cervical spine was performed. No intravenous contrast was administered.  COMPARISON:  11/26/2009 cervical spine MR. Brain MR performed early in the day and dictated separately.  FINDINGS: Cervical medullary junction and visualized intracranial structures unremarkable. No focal cervical cord signal abnormality. Visualized paravertebral structures unremarkable.  C2-3:  Negative.  C3-4: Broad-based disc osteophyte complex greater to left with flattening of the ventral aspect of the thecal sac and minimal left-sided cord contact without compression. Uncinate hypertrophy with mild bilateral foraminal narrowing greater on the left.  C4-5: Prior fusion. Spur right paracentral position with impression upon the ventral aspect of the thecal sac approaching but not compressing the cord.  C5-6: Prior fusion. No significant spinal stenosis or foraminal narrowing.  C6-7: Prior fusion. No significant spinal stenosis or foraminal narrowing.  C7-T1:  No significant spinal stenosis or foraminal narrowing.  IMPRESSION: Prior fusion C4-C7 with spur right paracentral position C4-5 level without unchanged. Otherwise no significant spinal stenosis or foraminal narrowing at the fused levels.  Slight progression of degenerative changes C3-4 level with broad-based disc osteophyte complex greater to left causing narrowing of the ventral aspect of the thecal sac greater on the left and mild bilateral foraminal narrowing.   Electronically Signed   By: Lacy Duverney M.D.   On: 12/11/2014 13:32   Mr Maxine Glenn Head/brain ZO  Cm  12/11/2014   CLINICAL DATA:  Initial evaluation for recurrent left facial, arm, leg paresthesias.  EXAM: MRI HEAD WITHOUT CONTRAST  MRA HEAD WITHOUT CONTRAST  TECHNIQUE: Multiplanar, multiecho pulse sequences of the brain and surrounding structures were obtained without intravenous contrast. Angiographic images of the head were obtained using MRA technique without contrast.  COMPARISON:  Prior CT from 12/10/2014 as well as MRI from 04/06/2013  FINDINGS: MRI HEAD FINDINGS  Mild diffuse prominence of the CSF containing spaces is compatible with generalized cerebral atrophy. Very mild chronic small vessel ischemic changes present within the periventricular and deep white matter. Probable small remote bilateral cerebellar infarcts noted.  No abnormal foci of restricted diffusion to suggest acute intracranial infarct. Gray-white matter differentiation maintained. Normal intravascular flow voids are preserved. No acute or chronic intracranial hemorrhage.  No mass lesion or midline shift. No mass effect. Ventricles are normal size without evidence of hydrocephalus. No extra-axial fluid collection.  Craniocervical junction within normal limits. Mild degenerative changes present within the visualized upper cervical spine. Pituitary gland within normal limits. No acute abnormality about the orbits.  Mild polypoid opacity present within the maxillary sinuses. Paranasal sinuses are otherwise clear. Scattered fluid opacity present within the left mastoid air cells. Right mastoid air cells clear. Inner ear structures normal.  Bone marrow signal intensity within normal limits. No scalp soft tissue abnormality.  MRA HEAD FINDINGS  ANTERIOR CIRCULATION:  Visualized portions of the distal cervical segments of the internal carotid arteries are widely patent with antegrade flow. The petrous, cavernous, and supra clinoid segments are widely patent. A1 segments, anterior communicating artery, and anterior cerebral arteries well  opacified. M1 segments widely patent without stenosis or occlusion. Distal MCA branches within normal limits.  POSTERIOR CIRCULATION:  Vertebral arteries are widely patent to the vertebrobasilar junction. Posterior inferior cerebral arteries opacified bilaterally. Basilar artery widely patent. Superior cerebellar arteries well opacified. There appears to be fetal origin of the PCAs bilaterally. PCAs themselves are widely patent.  No aneurysm or vascular malformation.  IMPRESSION: MRI HEAD IMPRESSION:  1. No acute intracranial infarct or other process identified. 2. Mild generalized cerebral atrophy with chronic small vessel ischemic disease.  MRA HEAD IMPRESSION:  Normal MRA of the intracranial circulation.   Electronically Signed   By: Rise Mu M.D.   On: 12/11/2014 04:16         Subjective:  patient continues to have numbness and tingling from his head to his toes. Denies any chest pain, shortness breath, nausea, vomiting, diarrhea, abdominal pain, dysuria, hematuria. No visual disturbance. No headache. Denies any dysarthria   Objective: Filed Vitals:   12/11/14 1000 12/11/14 1305 12/11/14 1500 12/11/14 1700  BP: 136/81 122/87 131/74 140/76  Pulse: 73 75 59 62  Temp: 98.1 F (36.7 C) 97.6 F (36.4 C) 97.4 F (36.3 C) 98.2 F (36.8 C)  TempSrc: Oral Oral Oral Oral  Resp: Height:      Weight:      SpO2: 100% 100% 99% 100%    Intake/Output Summary (Last 24 hours) at 12/11/14 1921 Last data filed at 12/11/14 1835  Gross per 24 hour  Intake 801.67 ml  Output    300 ml  Net 501.67 ml   Weight change:  Exam:   General:  Pt is alert, follows commands appropriately, not in acute distress  HEENT: No icterus, No thrush, No neck mass, [place]/AT  Cardiovascular: RRR, S1/S2, no rubs, no gallops  Respiratory: CTA bilaterally, no wheezing, no  crackles, no rhonchi  Abdomen: Soft/+BS, non tender, non distended, no guarding  Extremities: No edema, No lymphangitis,  No petechiae, No rashes, no synovitis  Data Reviewed: Basic Metabolic Panel:  Recent Labs Lab 12/10/14 2023 12/10/14 2037 12/11/14 0100  NA 142 138  --   K 3.9 3.8  --   CL 101 103  --   CO2  --  29  --   GLUCOSE 87 89  --   BUN 19 19  --   CREATININE 1.30* 1.23 1.19  CALCIUM  --  8.8*  --    Liver Function Tests:  Recent Labs Lab 12/10/14 2037  AST 22  ALT 16*  ALKPHOS 50  BILITOT 0.7  PROT 7.1  ALBUMIN 4.2   No results for input(s): LIPASE, AMYLASE in the last 168 hours. No results for input(s): AMMONIA in the last 168 hours. CBC:  Recent Labs Lab 12/10/14 2023 12/10/14 2037 12/11/14 0100  WBC  --  8.0 7.7  NEUTROABS  --  4.2  --   HGB 15.6 15.2 15.1  HCT 46.0 44.4 43.1  MCV  --  86.5 84.5  PLT  --  259 233   Cardiac Enzymes: No results for input(s): CKTOTAL, CKMB, CKMBINDEX, TROPONINI in the last 168 hours. BNP: Invalid input(s): POCBNP CBG:  Recent Labs Lab 12/11/14 0823 12/11/14 1307 12/11/14 1713  GLUCAP 116* 97 93    No results found for this or any previous visit (from the past 240 hour(s)).   Scheduled Meds: . [START ON 12/12/2014] aspirin EC  325 mg Oral Daily  . bisoprolol-hydrochlorothiazide  1 tablet Oral QPM  . [START ON 12/12/2014] clopidogrel  75 mg Oral Daily  . doxycycline  200 mg Oral Once  . heparin  5,000 Units Subcutaneous 3 times per day  . levothyroxine  50 mcg Oral QAC breakfast  . lisinopril  5 mg Oral Daily  . simvastatin  40 mg Oral q1800  . topiramate  25 mg Oral BID   Continuous Infusions: . sodium chloride 500 mL (12/11/14 1335)     Whitman Meinhardt, DO  [facility] Pager [phone]  If 7PM-7AM, please contact night-coverage [email or web address] Password [email or web address] 12/11/2014, 7:21 PM   LOS: 1 day

## 2014-12-11 NOTE — Progress Notes (Signed)
STROKE TEAM PROGRESS NOTE   HISTORY Derrick [name] is an 64 y.o. male hx of HLD, Hypothyroidism presenting with recurrent episodes of left face/arm/leg paresthesias. These episodes typically last < 30 minutes and self resolve. Today he also noted some difficulty using his left arm during one of these episodes. Also notes a ringing sensation in his ear during these episodes. Notes that the symptoms start in his face and radiate down. Notes some slurred speech during the episodes. Denies any visual changes.   A tick was found on the patient in the ED, per ED it was alive and did not appear engorged. He takes viagara and cialis but does not correlate use of these medications with the event. Notes his thyroid medication was recently adjusted.   CT head imaging reviewed and overall unremarkable.   Patient was not administered TPA secondary to  Transient symptoms. He was admitted to the neuro floor bed for further evaluation and treatment.   SUBJECTIVE (INTERVAL HISTORY) His RN is at the bedside.  Overall he feels his condition is gradually worsening. Patient reports current L body numbness/tinginling. States xanax helps. RN reports he told her about the symptoms at 8a and they have continued since that time. Describes it as L whole body symptoms. He associates the symptoms with a recent increase in his synthroid medication.    OBJECTIVE Temp:  [97.5 F (36.4 C)-98.5 F (36.9 C)] 98.1 F (36.7 C) (06/14 0800) Pulse Rate:  [54-71] 66 (06/14 0800) Cardiac Rhythm:  [-] Sinus bradycardia;Heart block (06/14 0410) Resp:  [10-23] 20 (06/14 0800) BP: (115-163)/(72-89) 136/80 mmHg (06/14 0800) SpO2:  [82 %-100 %] 99 % (06/14 0800) Weight:  [85.594 kg (188 lb 11.2 oz)] 85.594 kg (188 lb 11.2 oz) (06/14 0653)   Recent Labs Lab 12/11/14 0823  GLUCAP 116*    Recent Labs Lab 12/10/14 2037 12/11/14 0100  NA 138  --   K 3.8  --   CL 103  --   CO2 29  --   GLUCOSE 89  --   BUN 19  --   CREATININE  1.23 1.19  CALCIUM 8.8*  --     Recent Labs Lab 12/10/14 2037  AST 22  ALT 16*  ALKPHOS 50  BILITOT 0.7  PROT 7.1  ALBUMIN 4.2    Recent Labs Lab 12/10/14 2037 12/11/14 0100  WBC 8.0 7.7  NEUTROABS 4.2  --   HGB 15.2 15.1  HCT 44.4 43.1  MCV 86.5 84.5  PLT 259 233   No results for input(s): CKTOTAL, CKMB, CKMBINDEX, TROPONINI in the last 168 hours.  Recent Labs  12/10/14 2037  LABPROT 14.6  INR 1.12    Recent Labs  12/10/14 2051  COLORURINE YELLOW  LABSPEC 1.006  PHURINE 6.5  GLUCOSEU NEGATIVE  HGBUR NEGATIVE  BILIRUBINUR NEGATIVE  KETONESUR NEGATIVE  PROTEINUR NEGATIVE  UROBILINOGEN 0.2  NITRITE NEGATIVE  LEUKOCYTESUR NEGATIVE       Component Value Date/Time   CHOL 177 12/11/2014 0100   TRIG 135 12/11/2014 0100   HDL 29* 12/11/2014 0100   CHOLHDL 6.1 12/11/2014 0100   VLDL 27 12/11/2014 0100   LDLCALC 121* 12/11/2014 0100   No results found for: HGBA1C    Component Value Date/Time   LABOPIA NONE DETECTED 12/11/2014 0122   COCAINSCRNUR NONE DETECTED 12/11/2014 0122   LABBENZ POSITIVE* 12/11/2014 0122   AMPHETMU NONE DETECTED 12/11/2014 0122   THCU NONE DETECTED 12/11/2014 0122   LABBARB NONE DETECTED 12/11/2014 0122  Recent Labs Lab 12/10/14 2014  ETH <5    Ct Head Wo Contrast 12/10/2014    1. No acute intracranial pathology seen on CT. 2. Mild small vessel ischemic microangiopathy and likely mild cortical volume loss.     MRI HEAD  12/11/2014   1. No acute intracranial infarct or other process identified. 2. Mild generalized cerebral atrophy with chronic small vessel ischemic disease.   MRA HEAD  12/11/2014   Normal MRA of the intracranial circulation.    Carotid dopplers : 1-39% bilateral carotid stenosis only   PHYSICAL EXAM Pleasant obese middle aged Caucasian male not in distress. . Afebrile. Head is nontraumatic. Neck is supple without bruit.    Cardiac exam no murmur or gallop. Lungs are clear to auscultation. Distal  pulses are well felt. Neurological Exam ;  Awake  Alert oriented x 3. Normal speech and language.eye movements full without nystagmus.fundi were not visualized. Vision acuity and fields appear normal. Hearing is normal. Palatal movements are normal. Face symmetric. Tongue midline. Normal strength, tone, reflexes and coordination. Normal sensation. Gait deferred. ASSESSMENT/PLAN Derrick [name] is a 64 y.o. male with history of HLD, Hypothyroidism, and neck surgery presenting with transient left sided sensory deficits and gait instability. He did  not receive IV t-PA  due to transient and nondisabling deficits.   Recurrent right brainTIAs:  Non Dominant  secondary to  mall vessel disease    Resultant  Subjective left body paresthesias but no sensory loss  MRI  No acute stroke  MRA :  Normal MRA of the intracranial circulation.  Carotid Doppler  No significant stenosis  2D Echo  Canceled. Echo done in April 28,2016 :  Left ventricle: The cavity size was normal. Wall thickness was normal. Systolic function was normal. The estimated ejection fraction was in the range of 55% to 60%. Doppler parameters are consistent with abnormal left ventricular relaxation (grade 1 diastolic dysfunction).   LDL 121 mg%  HgbA1c pending  No motor deficits hence nothing for VTE prophylaxis Diet Heart Room service appropriate?: Yes; Fluid consistency:: Thin  aspirin 81 mg orally every day prior to admission, now on aspirin 325 mg orally every day  Patient counseled to be compliant with his antithrombotic medications  Ongoing aggressive stroke risk factor management  Therapy recommendations: pending  Disposition:  home  Hypertension  Home meds:   Ziac ( 5/6.25 mg) and Lisinipril 5 mg resumed in hospital   Stable   Patient counseled to be compliant with his blood pressure medications  Hyperlipidemia  Home meds:  npne  LDL 121, goal < 70  Add zocor 40 mg  Continue statin at  discharge  Diabetes  HgbA1c  pending, goal < 7.0     Other Stroke Risk Factors   Cigarette smoker,-never smoked   ETOH use -nondrinker   Obesity, Body mass index is 27.08 kg/(m^2).   No Hx stroke/TIA  No Family hx stroke   No h/oCoronary artery disease  No h/obstructive sleep apnea,    Other Active Problems  Hypothyroid Hx cervical herniated discs w/ surgery. Reported stiff neck and "sore all the time".   Other Pertinent History     Hospital day # 1  I have personally examined this patient, reviewed notes, independently viewed imaging studies, participated in medical decision making and plan of care. I have made any additions or clarifications directly to the above note. He has had recurrent transient episodes of left body and face paresthesias lasting variable periods from 5-20 minutes  of unclear etiology but certainly TIA secondary to small vessel disease is a possibility given his stroke risk factors. He remains at risk for recurrent strokes, TIAs, neurological worsening and needs ongoing stroke evaluation. Recommend increase aspirin to 325 mg daily and add statin for his elevated lipids. Check MRI scan of the cervical spine due to his history of neck pain and prior surgery. Add Topamax for his paresthesias. Delia Heady, MD Medical Director [facility] Pager: [phone] 12/11/2014 3:17 PM     To contact Stroke Continuity provider, please refer to [email or web address]. After hours, contact General Neurology

## 2014-12-11 NOTE — Progress Notes (Signed)
OT Cancellation Note  Patient Details Name: Derrick [name] MRN: [id number] DOB: Mar 24, 1951   Cancelled Treatment:    Reason Eval/Treat Not Completed: OT screened, no needs identified, will sign off.  Earlie Raveling OTR/L [phone] 12/11/2014, 4:03 PM

## 2014-12-11 NOTE — Evaluation (Signed)
Physical Therapy Evaluation Patient Details Name: Derrick [name] MRN: [id number] DOB: 08/12/1950 Today's Date: 12/11/2014   History of Present Illness  Derrick [name] is a 64 y.o.male who presented to ED on 12/10/14 with left sided numbness/tingling from face down the left side of his body. These symptoms were intermittent for the previous week. CT and MRI were negative, more work up to follow. PMH includes: HTN, hyperlipidemia, hypothyroidism.     Clinical Impression  Patient evaluated by Physical Therapy with no further acute PT needs identified. All education, including stroke risk factors and signs of a stroke, has been completed and the patient has no further questions. PT is signing off. Thank you for this referral.    Follow Up Recommendations No PT follow up    Equipment Recommendations  None recommended by PT    Recommendations for Other Services       Precautions / Restrictions Restrictions Weight Bearing Restrictions: No      Mobility  Bed Mobility                  Transfers Overall transfer level: Independent Equipment used: None                Ambulation/Gait Ambulation/Gait assistance: Independent Ambulation Distance (Feet): 200 Feet Assistive device: None Gait Pattern/deviations: Step-through pattern;Decreased stance time - left;Decreased dorsiflexion - left   Gait velocity interpretation: at or above normal speed for age/gender    Stairs Stairs: Yes Stairs assistance: Independent Stair Management: One rail Right;Alternating pattern Number of Stairs: 5    Wheelchair Mobility    Modified Rankin (Stroke Patients Only)       Balance Overall balance assessment: No apparent balance deficits (not formally assessed)                                           Pertinent Vitals/Pain Pain Assessment: No/denies pain    Home Living Family/patient expects to be discharged to:: Private residence Living Arrangements:  Spouse/significant other Available Help at Discharge: Family Type of Home: House       Home Layout: Two level Home Equipment: None      Prior Function Level of Independence: Independent               Hand Dominance        Extremity/Trunk Assessment   Upper Extremity Assessment: Overall WFL for tasks assessed           Lower Extremity Assessment: Overall WFL for tasks assessed         Communication   Communication: Other (comment) (Slurred speech: per pt, possibly due to taking his Xanax medication.)  Cognition Arousal/Alertness: Awake/alert Behavior During Therapy: WFL for tasks assessed/performed Overall Cognitive Status: Within Functional Limits for tasks assessed                      General Comments General comments (skin integrity, edema, etc.): Derrick [name] expressed that he often experiences the numbness/tingling in the back of his leg after sitting on a hard surface, likely indicating sciatic nerve involvement. It was explained to the pt that while the sciatic nerve may be contributing to his symptoms, it does not account for all of his symptoms. He also noted that he typically wears ear plugs while in a loud environment; however, he recently did not wear them, in which some ear ringing and dizziness  followed. Vestibular involvement is plausible. Based on Derrick [name] description of symptoms, it is possible that he has multiple problems contributing to his symptoms.     Exercises        Assessment/Plan    PT Assessment Patent does not need any further PT services  PT Diagnosis     PT Problem List    PT Treatment Interventions     PT Goals (Current goals can be found in the Care Plan section) Acute Rehab PT Goals Patient Stated Goal: Return home PT Goal Formulation: With patient Time For Goal Achievement: 12/25/14 Potential to Achieve Goals: Good    Frequency     Barriers to discharge        Co-evaluation               End of  Session   Activity Tolerance: Patient tolerated treatment well Patient left: Other (comment) (in wheelchair for transport to procedure) Nurse Communication: Mobility status         Time: 6286-3817 PT Time Calculation (min) (ACUTE ONLY): 22 min   Charges:         PT G Codes:        Derrick [name], SPT Acute Rehab Services Office: [phone]   Derrick [name] 12/11/2014, 11:28 AM

## 2014-12-11 NOTE — Plan of Care (Signed)
Problem: Discharge Progression Outcomes Goal: Pain controlled with appropriate interventions Outcome: Not Applicable Date Met:  28/31/51 Care plan changed Goal: Hemodynamically stable Outcome: Not Applicable Date Met:  76/16/07 Care plan changed Goal: Complications resolved/controlled Outcome: Not Applicable Date Met:  37/10/62 Care plan changed Goal: Tolerating diet Outcome: Not Applicable Date Met:  69/48/54 Care plan changed    Goal: Activity appropriate for discharge plan Outcome: Not Applicable Date Met:  62/70/35 Care plan changed    Goal: Other Discharge Outcomes/Goals Outcome: Not Applicable Date Met:  00/93/81 Care plan changed

## 2014-12-11 NOTE — Consult Note (Signed)
Consult Reason for Consult:transient left sided sensory and motor deficits Referring Physician: Dr Maurine Simmering ED  CC: transient left sided sensory deficits  HPI: Derrick [name] is an 64 y.o. male hx of HLD, Hypothyroidism presenting with recurrent episodes of left face/arm/leg paresthesias. These episodes typically last < 30 minutes and self resolve. Today he also noted some difficulty using his left arm during one of these episodes. Also notes a ringing sensation in his ear during these episodes. Notes that the symptoms start in his face and radiate down. Notes some slurred speech during the episodes. Denies any visual changes.   A tick was found on the patient in the ED, per ED it was alive and did not appear engorged. He takes viagara and cialis but does not correlate use of these medications with the event. Notes his thyroid medication was recently adjusted.   CT head imaging reviewed and overall unremarkable.   Past Medical History  Diagnosis Date  . Hematuria   . Arthritis NECK  . S/P cervical spinal fusion   . Hypertension CARDIOLOGIST- [facility]    STRESS TEST YRS AGO-- OK  ; LAST VISIT 1 MONTH AGO  . Hypothyroidism   . History of prostatitis   . ED (erectile dysfunction)     intolerant to viagra and cialis  . Hyperlipidemia   . Palpitations     event monitor 03-2012-showed a single 6 beat run of NSVT; echo 05/25/12-EF55-60%, mild LVH  . Chest pain, atypical 2005    myoview 09/13/03-normal perfusion  . Vertigo 2005    nl carotid dopplers 08/09/2003    Past Surgical History  Procedure Laterality Date  . Anterior cervical decomp/discectomy fusion  07-29-2000    C4 - C7  . Cystoscopy w/ ureteroscopy  20 YRS AGO  (APPROX. 1990'S)  . Cystoscopy w/ retrogrades  12/25/2011    Procedure: CYSTOSCOPY WITH RETROGRADE PYELOGRAM;  Surgeon: Marcine Matar, MD;  Location: [facility];  Service: Urology;  Laterality: Bilateral;    Family History  Problem  Relation Age of Onset  . CVA Father   . Healthy Sister   . Healthy Son   . Healthy Son   . Heart attack Mother   . Heart attack Brother     Social History:  reports that he has never smoked. He has never used smokeless tobacco. He reports that he does not drink alcohol or use illicit drugs.  No Known Allergies  Medications:  Scheduled: .  stroke: mapping our early stages of recovery book   Does not apply Once  . aspirin  325 mg Oral Daily  . bisoprolol-hydrochlorothiazide  1 tablet Oral QPM  . heparin  5,000 Units Subcutaneous 3 times per day  . levothyroxine  50 mcg Oral QAC breakfast  . lisinopril  5 mg Oral Daily    ROS: Out of a complete 14 system review, the patient complains of only the following symptoms, and all other reviewed systems are negative. +paresthesias  Physical Examination: Filed Vitals:   12/11/14 0033  BP: 142/78  Pulse: 57  Temp: 98.5 F (36.9 C)  Resp: 18   Physical Exam  Constitutional: He appears well-developed and well-nourished.  Psych: Affect appropriate to situation Eyes: No scleral injection HENT: No OP obstrucion Head: Normocephalic.  Cardiovascular: Normal rate and regular rhythm.  Respiratory: Effort normal and breath sounds normal.  GI: Soft. Bowel sounds are normal. No distension. There is no tenderness.  Skin: WDI  Neurologic Examination Mental Status: Alert, oriented, thought  content appropriate.  Speech fluent without evidence of aphasia.  Able to follow 3 step commands without difficulty. Cranial Nerves: II: funduscopic exam wnl bilaterally, visual fields grossly normal, pupils equal, round, reactive to light and accommodation III,IV, VI: ptosis not present, extra-ocular motions intact bilaterally V,VII: smile symmetric, facial light touch sensation normal bilaterally VIII: hearing normal bilaterally IX,X: gag reflex present XI: trapezius strength/neck flexion strength normal bilaterally XII: tongue strength normal   Motor: Right : Upper extremity    Left:     Upper extremity 5/5 deltoid       5/5 deltoid 5/5 biceps      5/5 biceps  5/5 triceps      5/5 triceps 5/5 hand grip      5/5 hand grip  Lower extremity     Lower extremity 5/5 hip flexor      5/5 hip flexor 5/5 quadricep      5/5 quadriceps  5/5 hamstrings     5/5 hamstrings 5/5 plantar flexion       5/5 plantar flexion 5/5 plantar extension     5/5 plantar extension Tone and bulk:normal tone throughout; no atrophy noted Sensory: Pinprick and light touch intact throughout, bilaterally Deep Tendon Reflexes: 2+ and symmetric throughout Plantars: Right: downgoing   Left: downgoing Cerebellar: normal finger-to-nose, normal rapid alternating movements and normal heel-to-shin test Gait: deferred  Laboratory Studies:   Basic Metabolic Panel:  Recent Labs Lab 12/10/14 2037  NA 138  K 3.8  CL 103  CO2 29  GLUCOSE 89  BUN 19  CREATININE 1.23  CALCIUM 8.8*    Liver Function Tests:  Recent Labs Lab 12/10/14 2037  AST 22  ALT 16*  ALKPHOS 50  BILITOT 0.7  PROT 7.1  ALBUMIN 4.2   No results for input(s): LIPASE, AMYLASE in the last 168 hours. No results for input(s): AMMONIA in the last 168 hours.  CBC:  Recent Labs Lab 12/10/14 2037  WBC 8.0  NEUTROABS 4.2  HGB 15.2  HCT 44.4  MCV 86.5  PLT 259    Cardiac Enzymes: No results for input(s): CKTOTAL, CKMB, CKMBINDEX, TROPONINI in the last 168 hours.  BNP: Invalid input(s): POCBNP  CBG: No results for input(s): GLUCAP in the last 168 hours.  Microbiology: No results found for this or any previous visit.  Coagulation Studies:  Recent Labs  12/10/14 2037  LABPROT 14.6  INR 1.12    Urinalysis:  Recent Labs Lab 12/10/14 2051  COLORURINE YELLOW  LABSPEC 1.006  PHURINE 6.5  GLUCOSEU NEGATIVE  HGBUR NEGATIVE  BILIRUBINUR NEGATIVE  KETONESUR NEGATIVE  PROTEINUR NEGATIVE  UROBILINOGEN 0.2  NITRITE NEGATIVE  LEUKOCYTESUR NEGATIVE    Lipid Panel:   No results found for: CHOL, TRIG, HDL, CHOLHDL, VLDL, LDLCALC  HgbA1C: No results found for: HGBA1C  Urine Drug Screen:  No results found for: LABOPIA, COCAINSCRNUR, LABBENZ, AMPHETMU, THCU, LABBARB  Alcohol Level:  Recent Labs Lab 12/10/14 2014  ETH <5    Other results:  Imaging: Ct Head Wo Contrast  12/10/2014   CLINICAL DATA:  Ringing in the left ear, and left-sided numbness for 2 weeks. Initial encounter.  EXAM: CT HEAD WITHOUT CONTRAST  TECHNIQUE: Contiguous axial images were obtained from the base of the skull through the vertex without intravenous contrast.  COMPARISON:  MRI of the brain performed 04/06/2013  FINDINGS: There is no evidence of acute infarction, mass lesion, or intra- or extra-axial hemorrhage on CT.  Prominence of the sulci suggests mild cortical volume loss. Mild  periventricular white matter change likely reflects small vessel ischemic microangiopathy.  The brainstem and fourth ventricle are within normal limits. The basal ganglia are unremarkable in appearance. The cerebral hemispheres demonstrate grossly normal gray-white differentiation. No mass effect or midline shift is seen.  There is no evidence of fracture; visualized osseous structures are unremarkable in appearance. The visualized portions of the orbits are within normal limits. The paranasal sinuses and mastoid air cells are well-aerated. No significant soft tissue abnormalities are seen.  IMPRESSION: 1. No acute intracranial pathology seen on CT. 2. Mild small vessel ischemic microangiopathy and likely mild cortical volume loss.   Electronically Signed   By: Roanna Raider M.D.   On: 12/10/2014 20:52     Assessment/Plan: 63y/o gentleman with hx of hypothyroidism and hyperlipidemia who was admitted for recurrent episodes of left sided paresthesias associated with tinnitus in his left ear. Episodes atypical for TIA but cannot rule this out.  1. HgbA1c, fasting lipid panel 2. MRI, MRA  of the brain without  contrast 3. Frequent neuro checks 4. Echocardiogram 5. Carotid dopplers 6. Prophylactic therapy-Antiplatelet med: Aspirin - dose  PO or  PR 7. Risk factor modification 8. Telemetry monitoring 9. PT consult, OT consult, Speech consult 10. NPO until RN stroke swallow screen   Elspeth Cho, DO [facility] [phone]  If 7pm- 7am, please page neurology on call as listed in AMION. 12/11/2014, 12:52 AM

## 2014-12-11 NOTE — Progress Notes (Signed)
VSS - Blood pressure 136/80, pulse 66, temperature 98.1 F (36.7 C), temperature source Oral, resp. rate 20, height 5\' 10"  (1.778 m), weight 85.594 kg (188 lb 11.2 oz), SpO2 99 %., r/a.  Pt. C/o of numbness & tingling on left side from head down to his feet, greater in side & hip/tail bone area which started around 8am this morning.  States he has been fine all night and numbness started about 1 hour after he took his synthroid pill this am.  Text paged Dr. Arbutus Leas, will continue to monitor and await for return call.  Forbes Cellar, RN

## 2014-12-12 DIAGNOSIS — G459 Transient cerebral ischemic attack, unspecified: Secondary | ICD-10-CM | POA: Diagnosis not present

## 2014-12-12 DIAGNOSIS — G458 Other transient cerebral ischemic attacks and related syndromes: Secondary | ICD-10-CM

## 2014-12-12 LAB — BASIC METABOLIC PANEL
Anion gap: 7 (ref 5–15)
BUN: 16 mg/dL (ref 6–20)
CO2: 29 mmol/L (ref 22–32)
Calcium: 9.1 mg/dL (ref 8.9–10.3)
Chloride: 104 mmol/L (ref 101–111)
Creatinine, Ser: 1.32 mg/dL — ABNORMAL HIGH (ref 0.61–1.24)
GFR calc non Af Amer: 55 mL/min — ABNORMAL LOW (ref >60)
Glucose, Bld: 102 mg/dL — ABNORMAL HIGH (ref 65–99)
POTASSIUM: 3.6 mmol/L (ref 3.5–5.1)
SODIUM: 140 mmol/L (ref 135–145)

## 2014-12-12 LAB — HIV ANTIBODY (ROUTINE TESTING W REFLEX): HIV SCREEN 4TH GENERATION: NONREACTIVE

## 2014-12-12 LAB — GLUCOSE, CAPILLARY
Glucose-Capillary: 94 mg/dL (ref 65–99)
Glucose-Capillary: 91 mg/dL (ref 65–99)

## 2014-12-12 LAB — FOLATE RBC
FOLATE, RBC: 1138 ng/mL (ref >498)
Folate, Hemolysate: 487.2 ng/mL
Hematocrit: 42.8 % (ref 37.5–51.0)

## 2014-12-12 LAB — HEMOGLOBIN A1C
Hgb A1c MFr Bld: 5.2 % (ref 4.8–5.6)
MEAN PLASMA GLUCOSE: 103 mg/dL

## 2014-12-12 LAB — RPR: RPR Ser Ql: NONREACTIVE

## 2014-12-12 LAB — B. BURGDORFI ANTIBODIES: B burgdorferi Ab IgG+IgM: <0.91 {ISR} (ref 0.00–0.90)

## 2014-12-12 LAB — TSH: TSH: 3.017 u[IU]/mL (ref 0.350–4.500)

## 2014-12-12 MED ORDER — ASPIRIN EC 81 MG PO TBEC: 81.0000 mg | DELAYED_RELEASE_TABLET | Freq: Every day | ORAL | Status: DC

## 2014-12-12 MED ORDER — SIMVASTATIN 40 MG PO TABS: 40.0000 mg | ORAL_TABLET | Freq: Every day | ORAL | Status: DC

## 2014-12-12 MED ORDER — TOPIRAMATE 25 MG PO TABS: 25.0000 mg | ORAL_TABLET | Freq: Two times a day (BID) | ORAL | Status: DC

## 2014-12-12 MED ORDER — ALPRAZOLAM 0.5 MG PO TABS
0.5000 mg | ORAL_TABLET | Freq: Once | ORAL | Status: AC
  Administered 2014-12-12: 0.5 mg via ORAL
  Filled 2014-12-12: qty 1

## 2014-12-12 NOTE — Progress Notes (Signed)
Patient was discharged home by MD order; discharged instructions  review and give to patient with care notes and prescriptions; IV DIC; skin intact; patient will be escorted to the car by a volunteer via wheelchair.  

## 2014-12-12 NOTE — Discharge Summary (Signed)
[facility]  Physician Discharge Summary   Patient ID: Derrick [name] MRN: [id number] DOB/AGE: [birth year] 64 y.o.  Admit date: 12/10/2014 Discharge date: 12/12/2014  PCP: Kirk Ruths, MD  DISCHARGE DIAGNOSES:  Principal Problem:   TIA (transient ischemic attack) Active Problems:   Hypothyroidism   Essential hypertension, benign   Hypercholesterolemia   Numbness and tingling   Paresthesia   Other specified transient cerebral ischemias   RECOMMENDATIONS FOR OUTPATIENT FOLLOW UP: 1. Patient asked to follow-up with his primary care physician regarding his symptoms and the MRI cervical spine report 2. Asian noted to be on both Cialis and Viagra. He has been asked to clarify this with his PCP.  DISCHARGE CONDITION: fair  Diet recommendation: Heart healthy  Filed Weights   12/11/14 0653  Weight: 85.594 kg (188 lb 11.2 oz)    INITIAL HISTORY: 64 year old male with a history of hypertension presents with 1-2 week history of intermittent dysesthesias manifesting as numbness and tingling extending from his left face, down to his left arm down to his left side of the trunk, down to his left lower extremity to his toes. he states that the episodes only last 1-2 minutes and then spontaneously resolved. However on 12/09/2014, his episodes of dysesthesia lasted longer. He took some Xanax which appeared to help it. However, after he woke up the dysesthesias continued. As a result, he came to emergency department for further evaluation. Notably, the patient goes biking for 8 miles daily. If he does not bike, he walks 2-4 miles daily. He has not had any worsening decrease of his stamina. The patient had an exercise stress test performed 10/25/2014 which was normal.  Consultations:  Neurology  Procedures: Carotid Doppler Findings suggest 1-39% internal carotid artery stenosis bilaterally. Vertebral arteries are patent with antegrade flow.  2-D echocardiogram Done recently in  April. Was not repeated.  HOSPITAL COURSE:   Left-Sided Dysesthesias  Etiology remains unclear. MRI brain negative for acute infarct. MRA brain negative. MRI cervical spine negative for any significant spinal stenosis or foraminal narrowing except for mild foraminal narrowing C3-4. Patient has been asked to discuss this, either with his PCP or with the surgeon who performed his previous fusion surgery. Suspect that the patient may have anxiety component. Carotid Dopplers unremarkable. TSH was normal. HIV is nonreactive. As his RPR. Vitamin B-12 level was normal. Discussed with neurology. They recommend Plavix.  Dyslipidemia  LDL 121. Started on a statin which will be prescribed at the time of discharge.  Essential Hypertension Blood pressure stable. Continue home medications.   Tick Bite Patient and wife felt it was "deer tick"--removed in ED. he was given one-time dose of doxycycline. No signs of erythema migrans on examination.  Hypothyroidism  Continue Synthroid   Overall, patient remained stable. He was seen by physical and occupational therapy. He has ambulated without any difficulties. He is very active at home. Etiology for symptoms, once again, remain unclear. At this time he has completed all potential workup that can be done as an inpatient. He should follow-up with his primary care physician for further management.    PERTINENT LABS:  The results of significant diagnostics from this hospitalization (including imaging, microbiology, ancillary and laboratory) are listed below for reference.     Labs: Basic Metabolic Panel:  Recent Labs Lab 12/10/14 2023 12/10/14 2037 12/11/14 0100 12/12/14 0639  NA 142 138  --  140  K 3.9 3.8  --  3.6  CL 101 103  --  104  CO2  --  29  --  29  GLUCOSE 87 89  --  102*  BUN 19 19  --  16  CREATININE 1.30* 1.23 1.19 1.32*  CALCIUM  --  8.8*  --  9.1   Liver Function Tests:  Recent Labs Lab 12/10/14 2037  AST 22  ALT 16*    ALKPHOS 50  BILITOT 0.7  PROT 7.1  ALBUMIN 4.2   CBC:  Recent Labs Lab 12/10/14 2023 12/10/14 2037 12/11/14 0100 12/11/14 1630  WBC  --  8.0 7.7  --   NEUTROABS  --  4.2  --   --   HGB 15.6 15.2 15.1  --   HCT 46.0 44.4 43.1 42.8  MCV  --  86.5 84.5  --   PLT  --  259 233  --    CBG:  Recent Labs Lab 12/11/14 1307 12/11/14 1713 12/11/14 2105 12/12/14 0808 12/12/14 1215  GLUCAP 97 93 96 94 91     IMAGING STUDIES Ct Head Wo Contrast  12/10/2014   CLINICAL DATA:  Ringing in the left ear, and left-sided numbness for 2 weeks. Initial encounter.  EXAM: CT HEAD WITHOUT CONTRAST  TECHNIQUE: Contiguous axial images were obtained from the base of the skull through the vertex without intravenous contrast.  COMPARISON:  MRI of the brain performed 04/06/2013  FINDINGS: There is no evidence of acute infarction, mass lesion, or intra- or extra-axial hemorrhage on CT.  Prominence of the sulci suggests mild cortical volume loss. Mild periventricular white matter change likely reflects small vessel ischemic microangiopathy.  The brainstem and fourth ventricle are within normal limits. The basal ganglia are unremarkable in appearance. The cerebral hemispheres demonstrate grossly normal gray-white differentiation. No mass effect or midline shift is seen.  There is no evidence of fracture; visualized osseous structures are unremarkable in appearance. The visualized portions of the orbits are within normal limits. The paranasal sinuses and mastoid air cells are well-aerated. No significant soft tissue abnormalities are seen.  IMPRESSION: 1. No acute intracranial pathology seen on CT. 2. Mild small vessel ischemic microangiopathy and likely mild cortical volume loss.   Electronically Signed   By: Roanna Raider M.D.   On: 12/10/2014 20:52   Mri Brain Without Contrast  12/11/2014   CLINICAL DATA:  Initial evaluation for recurrent left facial, arm, leg paresthesias.  EXAM: MRI HEAD WITHOUT CONTRAST   MRA HEAD WITHOUT CONTRAST  TECHNIQUE: Multiplanar, multiecho pulse sequences of the brain and surrounding structures were obtained without intravenous contrast. Angiographic images of the head were obtained using MRA technique without contrast.  COMPARISON:  Prior CT from 12/10/2014 as well as MRI from 04/06/2013  FINDINGS: MRI HEAD FINDINGS  Mild diffuse prominence of the CSF containing spaces is compatible with generalized cerebral atrophy. Very mild chronic small vessel ischemic changes present within the periventricular and deep white matter. Probable small remote bilateral cerebellar infarcts noted.  No abnormal foci of restricted diffusion to suggest acute intracranial infarct. Gray-white matter differentiation maintained. Normal intravascular flow voids are preserved. No acute or chronic intracranial hemorrhage.  No mass lesion or midline shift. No mass effect. Ventricles are normal size without evidence of hydrocephalus. No extra-axial fluid collection.  Craniocervical junction within normal limits. Mild degenerative changes present within the visualized upper cervical spine. Pituitary gland within normal limits. No acute abnormality about the orbits.  Mild polypoid opacity present within the maxillary sinuses. Paranasal sinuses are otherwise clear. Scattered fluid opacity present within the left mastoid air cells. Right mastoid air cells  clear. Inner ear structures normal.  Bone marrow signal intensity within normal limits. No scalp soft tissue abnormality.  MRA HEAD FINDINGS  ANTERIOR CIRCULATION:  Visualized portions of the distal cervical segments of the internal carotid arteries are widely patent with antegrade flow. The petrous, cavernous, and supra clinoid segments are widely patent. A1 segments, anterior communicating artery, and anterior cerebral arteries well opacified. M1 segments widely patent without stenosis or occlusion. Distal MCA branches within normal limits.  POSTERIOR CIRCULATION:   Vertebral arteries are widely patent to the vertebrobasilar junction. Posterior inferior cerebral arteries opacified bilaterally. Basilar artery widely patent. Superior cerebellar arteries well opacified. There appears to be fetal origin of the PCAs bilaterally. PCAs themselves are widely patent.  No aneurysm or vascular malformation.  IMPRESSION: MRI HEAD IMPRESSION:  1. No acute intracranial infarct or other process identified. 2. Mild generalized cerebral atrophy with chronic small vessel ischemic disease.  MRA HEAD IMPRESSION:  Normal MRA of the intracranial circulation.   Electronically Signed   By: Rise Mu M.D.   On: 12/11/2014 04:16   Mr Cervical Spine W Wo Contrast  12/11/2014   CLINICAL DATA:  64 year old hypertensive male with this is the series of the left arm. Prior cervical fusion. Subsequent encounter.  EXAM: MRI CERVICAL SPINE WITHOUT CONTRAST  TECHNIQUE: Multiplanar, multisequence MR imaging of the cervical spine was performed. No intravenous contrast was administered.  COMPARISON:  11/26/2009 cervical spine MR. Brain MR performed early in the day and dictated separately.  FINDINGS: Cervical medullary junction and visualized intracranial structures unremarkable. No focal cervical cord signal abnormality. Visualized paravertebral structures unremarkable.  C2-3:  Negative.  C3-4: Broad-based disc osteophyte complex greater to left with flattening of the ventral aspect of the thecal sac and minimal left-sided cord contact without compression. Uncinate hypertrophy with mild bilateral foraminal narrowing greater on the left.  C4-5: Prior fusion. Spur right paracentral position with impression upon the ventral aspect of the thecal sac approaching but not compressing the cord.  C5-6: Prior fusion. No significant spinal stenosis or foraminal narrowing.  C6-7: Prior fusion. No significant spinal stenosis or foraminal narrowing.  C7-T1:  No significant spinal stenosis or foraminal narrowing.   IMPRESSION: Prior fusion C4-C7 with spur right paracentral position C4-5 level without unchanged. Otherwise no significant spinal stenosis or foraminal narrowing at the fused levels.  Slight progression of degenerative changes C3-4 level with broad-based disc osteophyte complex greater to left causing narrowing of the ventral aspect of the thecal sac greater on the left and mild bilateral foraminal narrowing.   Electronically Signed   By: Lacy Duverney M.D.   On: 12/11/2014 13:32   Mr Maxine Glenn Head/brain Wo Cm  12/11/2014   CLINICAL DATA:  Initial evaluation for recurrent left facial, arm, leg paresthesias.  EXAM: MRI HEAD WITHOUT CONTRAST  MRA HEAD WITHOUT CONTRAST  TECHNIQUE: Multiplanar, multiecho pulse sequences of the brain and surrounding structures were obtained without intravenous contrast. Angiographic images of the head were obtained using MRA technique without contrast.  COMPARISON:  Prior CT from 12/10/2014 as well as MRI from 04/06/2013  FINDINGS: MRI HEAD FINDINGS  Mild diffuse prominence of the CSF containing spaces is compatible with generalized cerebral atrophy. Very mild chronic small vessel ischemic changes present within the periventricular and deep white matter. Probable small remote bilateral cerebellar infarcts noted.  No abnormal foci of restricted diffusion to suggest acute intracranial infarct. Gray-white matter differentiation maintained. Normal intravascular flow voids are preserved. No acute or chronic intracranial hemorrhage.  No mass lesion  or midline shift. No mass effect. Ventricles are normal size without evidence of hydrocephalus. No extra-axial fluid collection.  Craniocervical junction within normal limits. Mild degenerative changes present within the visualized upper cervical spine. Pituitary gland within normal limits. No acute abnormality about the orbits.  Mild polypoid opacity present within the maxillary sinuses. Paranasal sinuses are otherwise clear. Scattered fluid opacity  present within the left mastoid air cells. Right mastoid air cells clear. Inner ear structures normal.  Bone marrow signal intensity within normal limits. No scalp soft tissue abnormality.  MRA HEAD FINDINGS  ANTERIOR CIRCULATION:  Visualized portions of the distal cervical segments of the internal carotid arteries are widely patent with antegrade flow. The petrous, cavernous, and supra clinoid segments are widely patent. A1 segments, anterior communicating artery, and anterior cerebral arteries well opacified. M1 segments widely patent without stenosis or occlusion. Distal MCA branches within normal limits.  POSTERIOR CIRCULATION:  Vertebral arteries are widely patent to the vertebrobasilar junction. Posterior inferior cerebral arteries opacified bilaterally. Basilar artery widely patent. Superior cerebellar arteries well opacified. There appears to be fetal origin of the PCAs bilaterally. PCAs themselves are widely patent.  No aneurysm or vascular malformation.  IMPRESSION: MRI HEAD IMPRESSION:  1. No acute intracranial infarct or other process identified. 2. Mild generalized cerebral atrophy with chronic small vessel ischemic disease.  MRA HEAD IMPRESSION:  Normal MRA of the intracranial circulation.   Electronically Signed   By: Rise Mu M.D.   On: 12/11/2014 04:16    DISCHARGE EXAMINATION: Filed Vitals:   12/11/14 2148 12/12/14 0537 12/12/14 0554 12/12/14 0809  BP: 139/76 134/74  124/77  Pulse: 55 48 65 63  Temp: 97.8 F (36.6 C) 98 F (36.7 C)  97.3 F (36.3 C)  TempSrc: Oral Oral  Oral  Resp: 16 16    Height:      Weight:      SpO2: 100% 95%  100%   General appearance: alert, cooperative, appears stated age and no distress Resp: clear to auscultation bilaterally Cardio: regular rate and rhythm, S1, S2 normal, no murmur, click, rub or gallop GI: soft, non-tender; bowel sounds normal; no masses,  no organomegaly Extremities: extremities normal, atraumatic, no cyanosis or  edema  DISPOSITION: Home  Discharge Instructions    Call MD for:  difficulty breathing, headache or visual disturbances    Complete by:  As directed      Call MD for:  extreme fatigue    Complete by:  As directed      Call MD for:  persistant dizziness or light-headedness    Complete by:  As directed      Call MD for:  severe uncontrolled pain    Complete by:  As directed      Diet - low sodium heart healthy    Complete by:  As directed      Discharge instructions    Complete by:  As directed   The MRI of the cervical spine (neck) did suggest some arthritis and narrowing on the left side. Please talk to your PCP or with the surgeon who operated on you before. Please talk to your doctor about being on both Viagra and Cialis. You should be on only one of them.  You were cared for by a hospitalist during your hospital stay. If you have any questions about your discharge medications or the care you received while you were in the hospital after you are discharged, you can call the unit and asked to speak with  the hospitalist on call if the hospitalist that took care of you is not available. Once you are discharged, your primary care physician will handle any further medical issues. Please note that NO REFILLS for any discharge medications will be authorized once you are discharged, as it is imperative that you return to your primary care physician (or establish a relationship with a primary care physician if you do not have one) for your aftercare needs so that they can reassess your need for medications and monitor your lab values. If you do not have a primary care physician, you can call [phone] for a physician referral.     Increase activity slowly    Complete by:  As directed            ALLERGIES: No Known Allergies   Discharge Medication List as of 12/12/2014 11:56 AM    START taking these medications   Details  simvastatin (ZOCOR) 40 MG tablet Take 1 tablet (40 mg total) by mouth  daily at 6 PM., Starting 12/12/2014, Until Discontinued, Print    topiramate (TOPAMAX) 25 MG tablet Take 1 tablet (25 mg total) by mouth 2 (two) times daily., Starting 12/12/2014, Until Discontinued, Print      CONTINUE these medications which have NOT CHANGED   Details  ALPRAZolam (XANAX) 1 MG tablet Take 1 mg by mouth as needed for sleep. , Starting 05/09/2014, Until Discontinued, Historical Med    bisoprolol-hydrochlorothiazide (ZIAC) 5-6.25 MG per tablet Take 1 tablet by mouth every evening., Starting 07/17/2014, Until Discontinued, Normal    CIALIS 5 MG tablet Take by mouth as needed., Starting 09/04/2014, Until Discontinued, Historical Med    levothyroxine (SYNTHROID, LEVOTHROID) 50 MCG tablet Take 50 mcg by mouth daily before breakfast., Until Discontinued, Historical Med    lisinopril (PRINIVIL,ZESTRIL) 5 MG tablet TAKE 1 TABLET (5 MG TOTAL) BY MOUTH DAILY., Normal    VIAGRA 100 MG tablet Take 100 mg by mouth as needed., Starting 09/18/2013, Until Discontinued, Historical Med      STOP taking these medications     aspirin EC 81 MG tablet        Follow-up Information    Follow up with Kirk Ruths, MD. Schedule an appointment as soon as possible for a visit in 1 week.   Specialty:  Family Medicine   Why:  post hospitalization follow up   Contact information:   [address] [place] [address] [phone]       Follow up with SETHI,PRAMOD, MD. Schedule an appointment as soon as possible for a visit in 1 month.   Specialties:  Neurology, Radiology   Contact information:   [address] [place] [address] [phone]       TOTAL DISCHARGE TIME: 35 minutes  [facility]  [facility] Pager [phone]  12/12/2014, 4:22 PM

## 2014-12-12 NOTE — Progress Notes (Signed)
STROKE TEAM PROGRESS NOTE   HISTORY Derrick [name] is an 64 y.o. male hx of HLD, Hypothyroidism presenting with recurrent episodes of left face/arm/leg paresthesias. These episodes typically last < 30 minutes and self resolve. Today he also noted some difficulty using his left arm during one of these episodes. Also notes a ringing sensation in his ear during these episodes. Notes that the symptoms start in his face and radiate down. Notes some slurred speech during the episodes. Denies any visual changes.   A tick was found on the patient in the ED, per ED it was alive and did not appear engorged. He takes viagara and cialis but does not correlate use of these medications with the event. Notes his thyroid medication was recently adjusted.   CT head imaging reviewed and overall unremarkable.   Patient was not administered TPA secondary to  Transient symptoms. He was admitted to the neuro floor bed for further evaluation and treatment.   SUBJECTIVE (INTERVAL HISTORY) His RN is at the bedside.  Overall he feels his condition is gradually worsening. Patient reports current L body numbness/tinginling are improved. States xanax helps.    OBJECTIVE Temp:  [97.3 F (36.3 C)-98 F (36.7 C)] 97.3 F (36.3 C) (06/15 0809) Pulse Rate:  [48-65] 63 (06/15 0809) Cardiac Rhythm:  [-] Heart block (06/15 0815) Resp:  [16] 16 (06/15 0537) BP: (124-139)/(74-77) 124/77 mmHg (06/15 0809) SpO2:  [95 %-100 %] 100 % (06/15 0809)   Recent Labs Lab 12/11/14 1307 12/11/14 1713 12/11/14 2105 12/12/14 0808 12/12/14 1215  GLUCAP 97 93 96 94 91    Recent Labs Lab 12/10/14 2023 12/10/14 2037 12/11/14 0100 12/12/14 0639  NA 142 138  --  140  K 3.9 3.8  --  3.6  CL 101 103  --  104  CO2  --  29  --  29  GLUCOSE 87 89  --  102*  BUN 19 19  --  16  CREATININE 1.30* 1.23 1.19 1.32*  CALCIUM  --  8.8*  --  9.1    Recent Labs Lab 12/10/14 2037  AST 22  ALT 16*  ALKPHOS 50  BILITOT 0.7  PROT 7.1   ALBUMIN 4.2    Recent Labs Lab 12/10/14 2023 12/10/14 2037 12/11/14 0100 12/11/14 1630  WBC  --  8.0 7.7  --   NEUTROABS  --  4.2  --   --   HGB 15.6 15.2 15.1  --   HCT 46.0 44.4 43.1 42.8  MCV  --  86.5 84.5  --   PLT  --  259 233  --    No results for input(s): CKTOTAL, CKMB, CKMBINDEX, TROPONINI in the last 168 hours.  Recent Labs  12/10/14 2037  LABPROT 14.6  INR 1.12    Recent Labs  12/10/14 2051  COLORURINE YELLOW  LABSPEC 1.006  PHURINE 6.5  GLUCOSEU NEGATIVE  HGBUR NEGATIVE  BILIRUBINUR NEGATIVE  KETONESUR NEGATIVE  PROTEINUR NEGATIVE  UROBILINOGEN 0.2  NITRITE NEGATIVE  LEUKOCYTESUR NEGATIVE       Component Value Date/Time   CHOL 177 12/11/2014 0100   TRIG 135 12/11/2014 0100   HDL 29* 12/11/2014 0100   CHOLHDL 6.1 12/11/2014 0100   VLDL 27 12/11/2014 0100   LDLCALC 121* 12/11/2014 0100   Lab Results  Component Value Date   HGBA1C 5.2 12/11/2014      Component Value Date/Time   LABOPIA NONE DETECTED 12/11/2014 0122   COCAINSCRNUR NONE DETECTED 12/11/2014 0122   LABBENZ  POSITIVE* 12/11/2014 0122   AMPHETMU NONE DETECTED 12/11/2014 0122   THCU NONE DETECTED 12/11/2014 0122   LABBARB NONE DETECTED 12/11/2014 0122     Recent Labs Lab 12/10/14 2014  ETH <5    Ct Head Wo Contrast 12/10/2014    1. No acute intracranial pathology seen on CT. 2. Mild small vessel ischemic microangiopathy and likely mild cortical volume loss.     MRI HEAD  12/11/2014   1. No acute intracranial infarct or other process identified. 2. Mild generalized cerebral atrophy with chronic small vessel ischemic disease.   MRA HEAD  12/11/2014   Normal MRA of the intracranial circulation.    Carotid dopplers : 1-39% bilateral carotid stenosis only   PHYSICAL EXAM Pleasant obese middle aged Caucasian male not in distress. . Afebrile. Head is nontraumatic. Neck is supple without bruit.    Cardiac exam no murmur or gallop. Lungs are clear to auscultation. Distal pulses  are well felt. Neurological Exam ;  Awake  Alert oriented x 3. Normal speech and language.eye movements full without nystagmus.fundi were not visualized. Vision acuity and fields appear normal. Hearing is normal. Palatal movements are normal. Face symmetric. Tongue midline. Normal strength, tone, reflexes and coordination. Normal sensation. Gait deferred. ASSESSMENT/PLAN Mr. Derrick [name] is a 64 y.o. male with history of HLD, Hypothyroidism, and neck surgery presenting with transient left sided sensory deficits and gait instability. He did  not receive IV t-PA  due to transient and nondisabling deficits.   Recurrent right brainTIAs:  Non Dominant  secondary to  mall vessel disease    Resultant  Subjective left body paresthesias but no sensory loss  MRI  No acute stroke  MRA :  Normal MRA of the intracranial circulation.  Carotid Doppler  No significant stenosis  2D Echo  Canceled. Echo done in April 28,2016 :  Left ventricle: The cavity size was normal. Wall thickness was normal. Systolic function was normal. The estimated ejection fraction was in the range of 55% to 60%. Doppler parameters are consistent with abnormal left ventricular relaxation (grade 1 diastolic dysfunction).   LDL 121 mg%  HgbA1c  5.2  No motor deficits hence nothing for VTE prophylaxis Diet Heart Room service appropriate?: Yes; Fluid consistency:: Thin Diet - low sodium heart healthy  aspirin 81 mg orally every day prior to admission, now on aspirin 325 mg orally every day  Patient counseled to be compliant with his antithrombotic medications  Ongoing aggressive stroke risk factor management  Therapy recommendations: home  Disposition:  home  Hypertension  Home meds:   Ziac ( 5/6.25 mg) and Lisinipril 5 mg resumed in hospital   Stable   Patient counseled to be compliant with his blood pressure medications  Hyperlipidemia  Home meds:  npne  LDL 121, goal < 70  Add zocor 40  mg  Continue statin at discharge  Diabetes  HgbA1c  pending, goal < 7.0     Other Stroke Risk Factors   Cigarette smoker,-never smoked   ETOH use -nondrinker   Obesity, Body mass index is 27.08 kg/(m^2).   No Hx stroke/TIA  No Family hx stroke   No h/oCoronary artery disease  No h/obstructive sleep apnea,    Other Active Problems  Hypothyroid Hx cervical herniated discs w/ surgery. Reported stiff neck and "sore all the time".   Other Pertinent History     Hospital day # 2   Recommend Plavix 75 mg daily and add statin for his elevated lipids. Continue Topamax for  his paresthesias and xanax for anxeity and increase participation in activities for stress relaxation Delia Heady, MD Medical Director [facility] Pager: [phone] 12/12/2014 5:23 PM     To contact Stroke Continuity provider, please refer to [email or web address]. After hours, contact General Neurology

## 2014-12-12 NOTE — Discharge Instructions (Signed)
Transient Ischemic Attack  A transient ischemic attack (TIA) is a "warning stroke" that causes stroke-like symptoms. Unlike a stroke, a TIA does not cause permanent damage to the brain. The symptoms of a TIA can happen very fast and do not last long. It is important to know the symptoms of a TIA and what to do. This can help prevent a major stroke or death.  CAUSES   · A TIA is caused by a temporary blockage in an artery in the brain or neck (carotid artery). The blockage does not allow the brain to get the blood supply it needs and can cause different symptoms. The blockage can be caused by either:  ¨ A blood clot.  ¨ Fatty buildup (plaque) in a neck or brain artery.  RISK FACTORS  · High blood pressure (hypertension).  · High cholesterol.  · Diabetes mellitus.  · Heart disease.  · The build up of plaque in the blood vessels (peripheral artery disease or atherosclerosis).  · The build up of plaque in the blood vessels providing blood and oxygen to the brain (carotid artery stenosis).  · An abnormal heart rhythm (atrial fibrillation).  · Obesity.  · Smoking.  · Taking oral contraceptives (especially in combination with smoking).  · Physical inactivity.  · A diet high in fats, salt (sodium), and calories.  · Alcohol use.  · Use of illegal drugs (especially cocaine and methamphetamine).  · Being male.  · Being African American.  · Being over the age of 55.  · Family history of stroke.  · Previous history of blood clots, stroke, TIA, or heart attack.  · Sickle cell disease.  SYMPTOMS   TIA symptoms are the same as a stroke but are temporary. These symptoms usually develop suddenly, or may be newly present upon awakening from sleep:  · Sudden weakness or numbness of the face, arm, or leg, especially on one side of the body.  · Sudden trouble walking or difficulty moving arms or legs.  · Sudden confusion.  · Sudden personality changes.  · Trouble speaking (aphasia) or understanding.  · Difficulty swallowing.  · Sudden  trouble seeing in one or both eyes.  · Double vision.  · Dizziness.  · Loss of balance or coordination.  · Sudden severe headache with no known cause.  · Trouble reading or writing.  · Loss of bowel or bladder control.  · Loss of consciousness.  DIAGNOSIS   Your caregiver may be able to determine the presence or absence of a TIA based on your symptoms, history, and physical exam. Computed tomography (CT scan) of the brain is usually performed to help identify a TIA. Other tests may be done to diagnose a TIA. These tests may include:  · Electrocardiography.  · Continuous heart monitoring.  · Echocardiography.  · Carotid ultrasonography.  · Magnetic resonance imaging (MRI).  · A scan of the brain circulation.  · Blood tests.  PREVENTION   The risk of a TIA can be decreased by appropriately treating high blood pressure, high cholesterol, diabetes, heart disease, and obesity and by quitting smoking, limiting alcohol, and staying physically active.  TREATMENT   Time is of the essence. Since the symptoms of TIA are the same as a stroke, it is important to seek treatment as soon as possible because you may need a medicine to dissolve the clot (thrombolytic) that cannot be given if too much time has passed. Treatment options vary. Treatment options may include rest, oxygen, intravenous (IV) fluids,   and medicines to thin the blood (anticoagulants). Medicines and diet may be used to address diabetes, high blood pressure, and other risk factors. Measures will be taken to prevent short-term and long-term complications, including infection from breathing foreign material into the lungs (aspiration pneumonia), blood clots in the legs, and falls. Treatment options include procedures to either remove plaque in the carotid arteries or dilate carotid arteries that have narrowed due to plaque. Those procedures are:  · Carotid endarterectomy.  · Carotid angioplasty and stenting.  HOME CARE INSTRUCTIONS   · Take all medicines prescribed  by your caregiver. Follow the directions carefully. Medicines may be used to control risk factors for a stroke. Be sure you understand all your medicine instructions.  · You may be told to take aspirin or the anticoagulant warfarin. Warfarin needs to be taken exactly as instructed.  ¨ Taking too much or too little warfarin is dangerous. Too much warfarin increases the risk of bleeding. Too little warfarin continues to allow the risk for blood clots. While taking warfarin, you will need to have regular blood tests to measure your blood clotting time. A PT blood test measures how long it takes for blood to clot. Your PT is used to calculate another value called an INR. Your PT and INR help your caregiver to adjust your dose of warfarin. The dose can change for many reasons. It is critically important that you take warfarin exactly as prescribed.  ¨ Many foods, especially foods high in vitamin K can interfere with warfarin and affect the PT and INR. Foods high in vitamin K include spinach, kale, broccoli, cabbage, collard and turnip greens, brussels sprouts, peas, cauliflower, seaweed, and parsley as well as beef and pork liver, green tea, and soybean oil. You should eat a consistent amount of foods high in vitamin K. Avoid major changes in your diet, or notify your caregiver before changing your diet. Arrange a visit with a dietitian to answer your questions.  ¨ Many medicines can interfere with warfarin and affect the PT and INR. You must tell your caregiver about any and all medicines you take, this includes all vitamins and supplements. Be especially cautious with aspirin and anti-inflammatory medicines. Do not take or discontinue any prescribed or over-the-counter medicine except on the advice of your caregiver or pharmacist.  ¨ Warfarin can have side effects, such as excessive bruising or bleeding. You will need to hold pressure over cuts for longer than usual. Your caregiver or pharmacist will discuss other  potential side effects.  ¨ Avoid sports or activities that may cause injury or bleeding.  ¨ Be mindful when shaving, flossing your teeth, or handling sharp objects.  ¨ Alcohol can change the body's ability to handle warfarin. It is best to avoid alcoholic drinks or consume only very small amounts while taking warfarin. Notify your caregiver if you change your alcohol intake.  ¨ Notify your dentist or other caregivers before procedures.  · Eat a diet that includes 5 or more servings of fruits and vegetables each day. This may reduce the risk of stroke. Certain diets may be prescribed to address high blood pressure, high cholesterol, diabetes, or obesity.  ¨ A low-sodium, low-saturated fat, low-trans fat, low-cholesterol diet is recommended to manage high blood pressure.  ¨ A low-saturated fat, low-trans fat, low-cholesterol, and high-fiber diet may control cholesterol levels.  ¨ A controlled-carbohydrate, controlled-sugar diet is recommended to manage diabetes.  ¨ A reduced-calorie, low-sodium, low-saturated fat, low-trans fat, low-cholesterol diet is recommended to manage obesity.  ·   Maintain a healthy weight.  · Stay physically active. It is recommended that you get at least 30 minutes of activity on most or all days.  · Do not smoke.  · Limit alcohol use even if you are not taking warfarin. Moderate alcohol use is considered to be:  ¨ No more than 2 drinks each day for men.  ¨ No more than 1 drink each day for nonpregnant women.  · Stop drug abuse.  · Home safety. A safe home environment is important to reduce the risk of falls. Your caregiver may arrange for specialists to evaluate your home. Having grab bars in the bedroom and bathroom is often important. Your caregiver may arrange for equipment to be used at home, such as raised toilets and a seat for the shower.  · Follow all instructions for follow-up with your caregiver. This is very important. This includes any referrals and lab tests. Proper follow up can  prevent a stroke or another TIA from occurring.  SEEK MEDICAL CARE IF:  · You have personality changes.  · You have difficulty swallowing.  · You are seeing double.  · You have dizziness.  · You have a fever.  · You have skin breakdown.  SEEK IMMEDIATE MEDICAL CARE IF:   Any of these symptoms may represent a serious problem that is an emergency. Do not wait to see if the symptoms will go away. Get medical help right away. Call your local emergency services (911 in [place] Do not drive yourself to the hospital.  · You have sudden weakness or numbness of the face, arm, or leg, especially on one side of the body.  · You have sudden trouble walking or difficulty moving arms or legs.  · You have sudden confusion.  · You have trouble speaking (aphasia) or understanding.  · You have sudden trouble seeing in one or both eyes.  · You have a loss of balance or coordination.  · You have a sudden, severe headache with no known cause.  · You have new chest pain or an irregular heartbeat.  · You have a partial or total loss of consciousness.  MAKE SURE YOU:   · Understand these instructions.  · Will watch your condition.  · Will get help right away if you are not doing well or get worse.  Document Released: 03/25/2005 Document Revised: 06/20/2013 Document Reviewed: 09/20/2013  ExitCare® Patient Information ©2015 [employer]. This information is not intended to replace advice given to you by your health care provider. Make sure you discuss any questions you have with your health care provider.

## 2014-12-13 ENCOUNTER — Telehealth (INDEPENDENT_AMBULATORY_CARE_PROVIDER_SITE_OTHER): Payer: Self-pay | Admitting: *Deleted

## 2014-12-13 LAB — ROCKY MTN SPOTTED FVR ABS PNL(IGG+IGM)
RMSF IgG: NEGATIVE
RMSF IgM: 0.19 index (ref 0.00–0.89)

## 2014-12-13 LAB — T3: T3, Total: 93 ng/dL (ref 71–180)

## 2014-12-13 NOTE — Telephone Encounter (Signed)
Voice Mail 2:49    Patient calls in and states he had seen/talked to Dr. Karilyn Cota and said his insurance was running out end of Aug and he said he would do his procedure on a Monday in July.

## 2014-12-19 ENCOUNTER — Telehealth (INDEPENDENT_AMBULATORY_CARE_PROVIDER_SITE_OTHER): Payer: Self-pay | Admitting: *Deleted

## 2014-12-19 ENCOUNTER — Other Ambulatory Visit (INDEPENDENT_AMBULATORY_CARE_PROVIDER_SITE_OTHER): Payer: Self-pay | Admitting: *Deleted

## 2014-12-19 DIAGNOSIS — Z86010 Personal history of colonic polyps: Secondary | ICD-10-CM

## 2014-12-19 NOTE — Telephone Encounter (Signed)
TCS sch'd 01/07/15, patient aware

## 2014-12-19 NOTE — Telephone Encounter (Signed)
Patient needs trilyte 

## 2014-12-20 ENCOUNTER — Telehealth (INDEPENDENT_AMBULATORY_CARE_PROVIDER_SITE_OTHER): Payer: Self-pay | Admitting: *Deleted

## 2014-12-20 NOTE — Telephone Encounter (Signed)
agree

## 2014-12-20 NOTE — Telephone Encounter (Signed)
Referring MD/PCP: benjamin mann -- [facility]   Procedure: tcs  Reason/Indication:  Hx polyps  Has patient had this procedure before?  Yes, 2011 -- scanned  If so, when, by whom and where?    Is there a family history of colon cancer?  no  Who?  What age when diagnosed?    Is patient diabetic?   no      Does patient have prosthetic heart valve?  no  Do you have a pacemaker?  no  Has patient ever had endocarditis? no  Has patient had joint replacement within last 12 months?  n  Does patient tend to be constipated or take laxatives? no  Is patient on Coumadin, Plavix and/or Aspirin? yes  Medications: see epic  Allergies: nkda  Medication Adjustment: asa 2 days  Procedure date & time: 01/07/15 at 145

## 2014-12-24 MED ORDER — PEG 3350-KCL-NA BICARB-NACL 420 G PO SOLR: 4000.0000 mL | Freq: Once | ORAL | Status: DC

## 2015-01-07 ENCOUNTER — Encounter (HOSPITAL_COMMUNITY)
Admission: RE | Disposition: A | Discharge status: Home Health | Payer: Self-pay | Source: Ambulatory Visit | Attending: Internal Medicine

## 2015-01-07 ENCOUNTER — Encounter (HOSPITAL_COMMUNITY): Payer: Self-pay | Admitting: *Deleted

## 2015-01-07 ENCOUNTER — Ambulatory Visit (HOSPITAL_COMMUNITY)
Admission: RE | Admit: 2015-01-07 | Discharge: 2015-01-07 | Disposition: A | Discharge status: Home Health | Payer: 59 | Source: Ambulatory Visit | Attending: Internal Medicine | Admitting: Internal Medicine

## 2015-01-07 DIAGNOSIS — Z79899 Other long term (current) drug therapy: Secondary | ICD-10-CM | POA: Diagnosis not present

## 2015-01-07 DIAGNOSIS — E785 Hyperlipidemia, unspecified: Secondary | ICD-10-CM | POA: Diagnosis not present

## 2015-01-07 DIAGNOSIS — K644 Residual hemorrhoidal skin tags: Secondary | ICD-10-CM | POA: Diagnosis not present

## 2015-01-07 DIAGNOSIS — Z7982 Long term (current) use of aspirin: Secondary | ICD-10-CM | POA: Diagnosis not present

## 2015-01-07 DIAGNOSIS — Z09 Encounter for follow-up examination after completed treatment for conditions other than malignant neoplasm: Secondary | ICD-10-CM | POA: Diagnosis present

## 2015-01-07 DIAGNOSIS — N529 Male erectile dysfunction, unspecified: Secondary | ICD-10-CM | POA: Diagnosis not present

## 2015-01-07 DIAGNOSIS — K648 Other hemorrhoids: Secondary | ICD-10-CM | POA: Diagnosis not present

## 2015-01-07 DIAGNOSIS — E039 Hypothyroidism, unspecified: Secondary | ICD-10-CM | POA: Diagnosis not present

## 2015-01-07 DIAGNOSIS — Z86010 Personal history of colonic polyps: Secondary | ICD-10-CM | POA: Diagnosis not present

## 2015-01-07 DIAGNOSIS — Z981 Arthrodesis status: Secondary | ICD-10-CM | POA: Insufficient documentation

## 2015-01-07 DIAGNOSIS — I10 Essential (primary) hypertension: Secondary | ICD-10-CM | POA: Insufficient documentation

## 2015-01-07 HISTORY — PX: COLONOSCOPY: SHX5424

## 2015-01-07 SURGERY — COLONOSCOPY
Anesthesia: Moderate Sedation

## 2015-01-07 MED ORDER — MEPERIDINE HCL 50 MG/ML IJ SOLN
INTRAMUSCULAR | Status: AC
  Filled 2015-01-07: qty 1

## 2015-01-07 MED ORDER — SODIUM CHLORIDE 0.9 % IV SOLN
INTRAVENOUS | Status: DC
  Administered 2015-01-07: 13:00:00 via INTRAVENOUS

## 2015-01-07 MED ORDER — MEPERIDINE HCL 50 MG/ML IJ SOLN
INTRAMUSCULAR | Status: DC | PRN
  Administered 2015-01-07 (×2): 25 mg via INTRAVENOUS

## 2015-01-07 MED ORDER — MIDAZOLAM HCL 5 MG/5ML IJ SOLN
INTRAMUSCULAR | Status: DC | PRN
  Administered 2015-01-07: 3 mg via INTRAVENOUS
  Administered 2015-01-07: 2 mg via INTRAVENOUS
  Administered 2015-01-07: 1 mg via INTRAVENOUS
  Administered 2015-01-07: 2 mg via INTRAVENOUS

## 2015-01-07 MED ORDER — SIMETHICONE 40 MG/0.6ML PO SUSP
ORAL | Status: DC | PRN
  Administered 2015-01-07: 14:00:00

## 2015-01-07 MED ORDER — MIDAZOLAM HCL 5 MG/5ML IJ SOLN
INTRAMUSCULAR | Status: AC
  Filled 2015-01-07: qty 10

## 2015-01-07 NOTE — Discharge Instructions (Signed)
Resume usual medications and diet. No driving for 24 hours. Can wait 10 years before next colonoscopy.  Colonoscopy, Care After Refer to this sheet in the next few weeks. These instructions provide you with information on caring for yourself after your procedure. Your health care provider may also give you more specific instructions. Your treatment has been planned according to current medical practices, but problems sometimes occur. Call your health care provider if you have any problems or questions after your procedure. WHAT TO EXPECT AFTER THE PROCEDURE  After your procedure, it is typical to have the following:  A small amount of blood in your stool.  Moderate amounts of gas and mild abdominal cramping or bloating. HOME CARE INSTRUCTIONS  Do not drive, operate machinery, or sign important documents for 24 hours.  You may shower and resume your regular physical activities, but move at a slower pace for the first 24 hours.  Take frequent rest periods for the first 24 hours.  Walk around or put a warm pack on your abdomen to help reduce abdominal cramping and bloating.  Drink enough fluids to keep your urine clear or pale yellow.  You may resume your normal diet as instructed by your health care provider. Avoid heavy or fried foods that are hard to digest.  Avoid drinking alcohol for 24 hours or as instructed by your health care provider.  Only take over-the-counter or prescription medicines as directed by your health care provider.  If a tissue sample (biopsy) was taken during your procedure:  Do not take aspirin or blood thinners for 7 days, or as instructed by your health care provider.  Do not drink alcohol for 7 days, or as instructed by your health care provider.  Eat soft foods for the first 24 hours. SEEK MEDICAL CARE IF: You have persistent spotting of blood in your stool 2-3 days after the procedure. SEEK IMMEDIATE MEDICAL CARE IF:  You have more than a small  spotting of blood in your stool.  You pass large blood clots in your stool.  Your abdomen is swollen (distended).  You have nausea or vomiting.  You have a fever.  You have increasing abdominal pain that is not relieved with medicine.

## 2015-01-07 NOTE — Op Note (Signed)
COLONOSCOPY PROCEDURE REPORT  PATIENT:  Derrick [name]  MR#:  [id number] Birthdate:  1951-02-27, 64 y.o., male Endoscopist:  Dr. Malissa HippoNajeeb U. Perley Arthurs, MD Referred By: Lenise HeraldBenjamin Mann PA-C  Procedure Date: 01/07/2015  Procedure:   Colonoscopy  Indications:  Patient is 64 year old Caucasian male with history of colonic adenoma and is here for surveillance colonoscopy. He has occasional hematochezia felt to be secondary to hemorrhoids.  Informed Consent:  The procedure and risks were reviewed with the patient and informed consent was obtained.  Medications:  Demerol 50 mg IV Versed 8 mg IV  Description of procedure:  After a digital rectal exam was performed, that colonoscope was advanced from the anus through the rectum and colon to the area of the cecum, ileocecal valve and appendiceal orifice. The cecum was deeply intubated. These structures were well-seen and photographed for the record. From the level of the cecum and ileocecal valve, the scope was slowly and cautiously withdrawn. The mucosal surfaces were carefully surveyed utilizing scope tip to flexion to facilitate fold flattening as needed. The scope was pulled down into the rectum where a thorough exam including retroflexion was performed.  Findings:   Prep excellent. Normal mucosa of cecum, ascending colon, hepatic flexure, transverse colon, splenic flexure, descending and sigmoid colon. Normal rectal mucosa. Small hemorrhoids below the dentate line. Soft sentinel skin tag.   Therapeutic/Diagnostic Maneuvers Performed:   None  Complications:  None  Cecal Withdrawal Time:  9 minutes  Impression:  Normal colonoscopy except external hemorrhoids. No evidence of recurrent polyps.  Comment; Since no polyp found on this and prior colonoscopy he could wait 10 years before his next exam.  Recommendations:  Standard instructions given. Next colonoscopy in 10 years.  Shakaria Raphael U  01/07/2015 2:21 PM  CC: Dr. Loreta AveMANN, Sharlet SalinaBENJAMIN, PA-C &  Dr. Bonnetta BarryNo ref. provider found

## 2015-01-07 NOTE — H&P (Signed)
Derrick [name] is an 64 y.o. male.   Chief Complaint: Patient is here for colonoscopy. HPI: A shunt 64 year old Caucasian male with history of colonic adenoma and is here for surveillance colonoscopy. He denies melena or rectal bleeding or change in his bowel habits. He has intermittent spasm at RUQ and he is stressed. He is having intermittent patchy numbness involving part of his left ear left neck left on and leg. He had extensive workup at [facility] and stroke was ruled out. Family history is negative for CRC.  Past Medical History  Diagnosis Date  . Hematuria   . Arthritis NECK  . S/P cervical spinal fusion   . Hypertension CARDIOLOGIST- [facility]    STRESS TEST YRS AGO-- OK  ; LAST VISIT 1 MONTH AGO  . Hypothyroidism   . History of prostatitis   . ED (erectile dysfunction)     intolerant to viagra and cialis  . Hyperlipidemia   . Palpitations     event monitor 03-2012-showed a single 6 beat run of NSVT; echo 05/25/12-EF55-60%, mild LVH  . Chest pain, atypical 2005    myoview 09/13/03-normal perfusion  . Vertigo 2005    nl carotid dopplers 08/09/2003    Past Surgical History  Procedure Laterality Date  . Anterior cervical decomp/discectomy fusion  07-29-2000    C4 - C7  . Cystoscopy w/ ureteroscopy  20 YRS AGO  (APPROX. 1990'S)  . Cystoscopy w/ retrogrades  12/25/2011    Procedure: CYSTOSCOPY WITH RETROGRADE PYELOGRAM;  Surgeon: Marcine Matar, MD;  Location: [facility];  Service: Urology;  Laterality: Bilateral;    Family History  Problem Relation Age of Onset  . CVA Father   . Healthy Sister   . Healthy Son   . Healthy Son   . Heart attack Mother   . Heart attack Brother    Social History:  reports that he has never smoked. He has never used smokeless tobacco. He reports that he does not drink alcohol or use illicit drugs.  Allergies: No Known Allergies  Medications Prior to Admission  Medication Sig Dispense Refill  . aspirin EC 81 MG tablet  Take 81 mg by mouth daily.    . bisoprolol-hydrochlorothiazide (ZIAC) 5-6.25 MG per tablet Take 1 tablet by mouth every evening. 90 tablet 0  . CIALIS 5 MG tablet Take by mouth as needed.    . diazepam (VALIUM) 5 MG tablet take 1 tablet by mouth every 8 hours if needed for muscle spasm  0  . levothyroxine (SYNTHROID, LEVOTHROID) 50 MCG tablet Take 50 mcg by mouth daily before breakfast.    . lisinopril (PRINIVIL,ZESTRIL) 5 MG tablet TAKE 1 TABLET (5 MG TOTAL) BY MOUTH DAILY. 90 tablet 3  . polyethylene glycol-electrolytes (NULYTELY/GOLYTELY) 420 G solution Take 4,000 mLs by mouth once. 4000 mL 0  . simvastatin (ZOCOR) 40 MG tablet Take 1 tablet (40 mg total) by mouth daily at 6 PM. 30 tablet 1  . VIAGRA 100 MG tablet Take 100 mg by mouth as needed.    . topiramate (TOPAMAX) 25 MG tablet Take 1 tablet (25 mg total) by mouth 2 (two) times daily. (Patient not taking: Reported on 12/27/2014) 60 tablet 0    No results found for this or any previous visit (from the past 48 hour(s)). No results found.  ROS  Blood pressure 130/60, pulse 64, temperature 98 F (36.7 C), temperature source Oral, resp. rate 18, SpO2 100 %. Physical Exam  Constitutional: He appears well-developed.  HENT:  Mouth/Throat: Oropharynx is clear and moist.  Eyes: Conjunctivae are normal. No scleral icterus.  Neck: No thyromegaly present.  Cardiovascular: Normal rate, regular rhythm and normal heart sounds.   No murmur heard. Respiratory: Effort normal and breath sounds normal.  GI: Soft. He exhibits no distension and no mass. There is no tenderness.  Musculoskeletal: He exhibits no edema.  Lymphadenopathy:    He has no cervical adenopathy.  Neurological: He is alert.  Skin: Skin is warm and dry.     Assessment/Plan History of colonic adenoma. Surveillance colonoscopy.  Dresden Ament U 01/07/2015, 1:45 PM

## 2015-01-08 ENCOUNTER — Encounter (HOSPITAL_COMMUNITY): Payer: Self-pay | Admitting: Internal Medicine

## 2015-01-16 ENCOUNTER — Other Ambulatory Visit: Payer: Self-pay | Admitting: Cardiovascular Disease

## 2015-01-16 NOTE — Telephone Encounter (Signed)
Rx(s) sent to pharmacy electronically.  

## 2015-01-21 ENCOUNTER — Telehealth: Payer: Self-pay | Admitting: Cardiovascular Disease

## 2015-01-21 NOTE — Telephone Encounter (Signed)
Pt states left ear numbness and tingling. This has been going on for about 1 month.  He states he has had MRI, a "thing on the spine", "thing on the neck" (imaging/testing), nothing come of this.  States no shortness of breath, no pain.  Thinks this may be related to his zocor or some other medication - had noted among list of SE's.  Informed pt I would defer to pharmD to review possible med related causes, if no concerns give directions on who to f/u with.

## 2015-01-21 NOTE — Telephone Encounter (Signed)
Could be from simvastatin, listed but not common side effect (,1%).  Would suggest stopping for 4 weeks, if symptoms disappear then restart with a different statin.  If still a problem at 4 weeks, not related to simvastatin and he should restart.

## 2015-01-21 NOTE — Telephone Encounter (Signed)
Pt is calling in wanting to be seen for some numbness and tingling in his neck and down his left side. Please call  Thanks

## 2015-01-21 NOTE — Telephone Encounter (Signed)
Recommendations given to patient - he verbalized understanding.  Indicated that he had been on higher dose of zocor and had had tingling in legs d/t that - once he dropped the dose, noticed those SE's diminished - advised that this could correlate to current problem d/t similarity but reminded him, no way to know unless we give him enough time off medication.  Advised to call back in August to give [place] update - pt understands and amenable to plan.

## 2015-02-12 ENCOUNTER — Encounter: Payer: Self-pay | Admitting: Neurology

## 2015-02-12 ENCOUNTER — Ambulatory Visit (INDEPENDENT_AMBULATORY_CARE_PROVIDER_SITE_OTHER): Payer: 59 | Admitting: Neurology

## 2015-02-12 VITALS — BP 135/84 | HR 65 | Ht 70.0 in | Wt 191.2 lb

## 2015-02-12 DIAGNOSIS — R202 Paresthesia of skin: Secondary | ICD-10-CM | POA: Diagnosis not present

## 2015-02-12 DIAGNOSIS — R200 Anesthesia of skin: Secondary | ICD-10-CM

## 2015-02-12 NOTE — Patient Instructions (Signed)

## 2015-02-12 NOTE — Progress Notes (Signed)
Reason for visit: Left-sided paresthesias  Referring physician: Dr. Dwyane Luo  Derrick [name] is a 64 y.o. male  History of present illness:  Derrick [name] is a 65 year old right-handed white male with a history of onset of left-sided paresthesias approximately 1 week prior to a hospital admission on 12/10/2014. He indicates that initially the symptoms were intermittent in nature, but by the second hospital day, they converted to being persistent, and they have not gone away since that time. The paresthesias involve the tip of the left ear, going into the left neck and left side of the body and into the triceps area of the left arm and involving the left leg. The patient denies any weakness, slurred speech, vision changes, headache, dizziness, or gait instability. He denies any difficulty with memory, confusion, or difficulty swallowing. He indicates that heat exposure will worsen the symptoms, taking diazepam may improve the symptoms. The paresthesias never completely disappeared. He feels a tight sensation and a superficial tingling with light touch along the left side of the torso. The patient denies any problems controlling the bowels or the bladder. He denies any headaches. He has undergone a very extensive workup in the hospital. MRI evaluation of the brain was unremarkable, MRA and carotid Doppler study were unremarkable. A recent 2-D echocardiogram did not show any cardiac source of stroke. The patient was seen by Dr. Lovell Sheehan from neurosurgery, he underwent nerve conduction studies and EMG by Dr. Murray Hodgkins that were by his report were unremarkable. He claims that Dr. Lovell Sheehan also ordered MRI evaluation of the thoracic spine that was unremarkable. MRI of the cervical spine in the hospital was unremarkable. No evidence of demyelinating disease was seen. The patient did have 2 ticks identified on him at the time of admission, but blood work for [facility] spotted fever and Lyme disease were negative. HIV  testing was negative. He is able to perform all activities of daily living, this left-sided sensory change has not limited him from being active. He has been able to lose weight with a diet and exercise. He comes to this office for an evaluation.   Past Medical History  Diagnosis Date  . Hematuria   . Arthritis NECK  . S/P cervical spinal fusion   . Hypertension CARDIOLOGIST- [facility]    STRESS TEST YRS AGO-- OK  ; LAST VISIT 1 MONTH AGO  . Hypothyroidism   . History of prostatitis   . ED (erectile dysfunction)     intolerant to viagra and cialis  . Hyperlipidemia   . Palpitations     event monitor 03-2012-showed a single 6 beat run of NSVT; echo 05/25/12-EF55-60%, mild LVH  . Chest pain, atypical 2005    myoview 09/13/03-normal perfusion  . Vertigo 2005    nl carotid dopplers 08/09/2003  . Cervical radiculopathy   . TIA (transient ischemic attack)     Past Surgical History  Procedure Laterality Date  . Anterior cervical decomp/discectomy fusion  07-29-2000    C4 - C7  . Cystoscopy w/ ureteroscopy  20 YRS AGO  (APPROX. 1990'S)  . Cystoscopy w/ retrogrades  12/25/2011    Procedure: CYSTOSCOPY WITH RETROGRADE PYELOGRAM;  Surgeon: Marcine Matar, MD;  [facility];  Service: Urology;  Laterality: Bilateral;  . Colonoscopy N/A 01/07/2015    Procedure: COLONOSCOPY;  Surgeon: Malissa Hippo, MD;  [facility];  Service: Endoscopy;  Laterality: N/A;  145    Family History  Problem Relation Age of Onset  .  CVA Father   . Healthy Sister   . Healthy Son   . Healthy Son   . Heart attack Mother   . Heart attack Brother     Social history:  reports that he has never smoked. He has never used smokeless tobacco. He reports that he does not drink alcohol or use illicit drugs.  Medications:  Prior to Admission medications   Medication Sig Start Date End Date Taking? Authorizing Provider  aspirin EC 81 MG tablet Take 162 mg by mouth daily.     Yes Historical Provider, MD  bisoprolol-hydrochlorothiazide (ZIAC) 5-6.25 MG per tablet Take 1 tablet by mouth every evening. Patient taking differently: Take 1 tablet by mouth 2 (two) times daily.  01/16/15  Yes Mihai Croitoru, MD  CIALIS 5 MG tablet Take by mouth as needed. 09/04/14  Yes Historical Provider, MD  diazepam (VALIUM) 5 MG tablet take 1 tablet by mouth every 8 hours if needed for muscle spasm 12/18/14  Yes Historical Provider, MD  hydrocortisone (ANUSOL-HC) 25 MG suppository Place 25 mg rectally 2 (two) times daily.   Yes Historical Provider, MD  levothyroxine (SYNTHROID, LEVOTHROID) 50 MCG tablet Take 50 mcg by mouth daily before breakfast.   Yes Historical Provider, MD  lisinopril (PRINIVIL,ZESTRIL) 5 MG tablet TAKE 1 TABLET (5 MG TOTAL) BY MOUTH DAILY. 11/01/14  Yes Mihai Croitoru, MD  vardenafil (LEVITRA) 20 MG tablet Take 20 mg by mouth daily as needed for erectile dysfunction.   Yes Historical Provider, MD  VIAGRA 100 MG tablet Take 100 mg by mouth as needed. 09/18/13  Yes Historical Provider, MD     No Known Allergies  ROS:  Out of a complete 14 system review of symptoms, the patient complains only of the following symptoms, and all other reviewed systems are negative.  Ringing in the ears Numbness  Blood pressure 135/84, pulse 65, height 5\' 10"  (1.778 m), weight 191 lb 3.2 oz (86.728 kg).  Physical Exam  General: The patient is alert and cooperative at the time of the examination.  Eyes: Pupils are equal, round, and reactive to light. Discs are flat bilaterally.  Neck: The neck is supple, no carotid bruits are noted.  Respiratory: The respiratory examination is clear.  Cardiovascular: The cardiovascular examination reveals a regular rate and rhythm, no obvious murmurs or rubs are noted.  Skin: Extremities are without significant edema.  Neurologic Exam  Mental status: The patient is alert and oriented x 3 at the time of the examination. The patient has  apparent normal recent and remote memory, with an apparently normal attention span and concentration ability.  Cranial nerves: Facial symmetry is present. There is good sensation of the face to pinprick and soft touch bilaterally. The strength of the facial muscles and the muscles to head turning and shoulder shrug are normal bilaterally. Speech is well enunciated, no aphasia or dysarthria is noted. Extraocular movements are full. Visual fields are full. The tongue is midline, and the patient has symmetric elevation of the soft palate. No obvious hearing deficits are noted.  Motor: The motor testing reveals 5 over 5 strength of all 4 extremities. Good symmetric motor tone is noted throughout.  Sensory: Sensory testing is intact to pinprick, soft touch, vibration sensation, and position sense on all 4 extremities. No evidence of extinction is noted.  Coordination: Cerebellar testing reveals good finger-nose-finger and heel-to-shin bilaterally.  Gait and station: Gait is normal. Tandem gait is normal. Romberg is negative. No drift is seen.  Reflexes: Deep tendon  reflexes are symmetric and normal bilaterally. Toes are downgoing bilaterally.   Ct Head Wo Contrast 12/10/2014 1. No acute intracranial pathology seen on CT. 2. Mild small vessel ischemic microangiopathy and likely mild cortical volume loss.   MRI HEAD  12/11/2014 1. No acute intracranial infarct or other process identified. 2. Mild generalized cerebral atrophy with chronic small vessel ischemic disease.   * MRI scan images were reviewed online. I agree with the written report.   MRA HEAD  12/11/2014 Normal MRA of the intracranial circulation.   Carotid dopplers : 1-39% bilateral carotid stenosis only  2D echo 10/25/14:  Study Conclusions  - Left ventricle: The cavity size was normal. Wall thickness was normal. Systolic function was normal. The estimated ejection fraction was in the range of 55% to 60%. Doppler  parameters are consistent with abnormal left ventricular relaxation (grade 1 diastolic dysfunction). The E/e&' ratio is between 8-15, suggesting indeterminate LV filling pressure. - Ventricular septum: Septal motion is incoordinate. - Mitral valve: There was trivial regurgitation. - Tricuspid valve: There was trivial regurgitation. - Pulmonary arteries: PA peak pressure: 10 mm Hg (S). - Inferior vena cava: The vessel was normal in size. The respirophasic diameter changes were in the normal range (= 50%), consistent with normal central venous pressure.  Impressions:  - LVEF 55-60%, incoordinate septal motion, diastolic dysfunction, indeterminate LV filling pressure.   MRI cervical 12/11/14:  IMPRESSION: Prior fusion C4-C7 with spur right paracentral position C4-5 level without unchanged. Otherwise no significant spinal stenosis or foraminal narrowing at the fused levels.  Slight progression of degenerative changes C3-4 level with broad-based disc osteophyte complex greater to left causing narrowing of the ventral aspect of the thecal sac greater on the left and mild bilateral foraminal narrowing.  * MRI scan images were reviewed online. I agree with the written report.     Assessment/Plan:  1. Subjective paresthesias, left side  The patient has had an extensive workup without abnormalities noted. The patient continues to have subjective sensory complaints. The patient may have had a small brain stem stroke that was missed by MRI. At this point, the patient is to remain on aspirin. The cholesterol panel will need to be followed, he has a low HDL and a elevated LDL of 952. A very small vessel stroke may be missed by MRI. He will follow-up through this office on an as-needed basis. He is to contact our office if new symptoms arise.  Marlan Palau MD 02/12/2015 10:25 AM  [facility] [address] [place], [place]  [address]  Phone [phone] Fax [phone]

## 2015-03-14 ENCOUNTER — Telehealth: Payer: Self-pay | Admitting: Neurology

## 2015-03-14 NOTE — Telephone Encounter (Signed)
OK for cataract surgery 

## 2015-03-14 NOTE — Telephone Encounter (Signed)
I called the patient. He is having cataract surgery in both eyes. One will be done 03/21/15 and the other will be done sometime in October.

## 2015-03-14 NOTE — Telephone Encounter (Signed)
Pt called and needs a letter for his eye surgeon stating that pt is medically clear to have surgery Thusday, 03/21/15. Please call and advise [phone] Fax # [phone] if able to fax letter.

## 2015-03-19 NOTE — Telephone Encounter (Signed)
Letter faxed to patient (fax number provided is his home fax).

## 2015-06-04 ENCOUNTER — Ambulatory Visit (INDEPENDENT_AMBULATORY_CARE_PROVIDER_SITE_OTHER): Payer: 59 | Admitting: Internal Medicine

## 2015-07-16 ENCOUNTER — Ambulatory Visit (INDEPENDENT_AMBULATORY_CARE_PROVIDER_SITE_OTHER): Payer: Medicare Other | Admitting: Cardiovascular Disease

## 2015-07-16 ENCOUNTER — Encounter: Payer: Self-pay | Admitting: Cardiovascular Disease

## 2015-07-16 VITALS — BP 134/88 | HR 66 | Ht 70.0 in | Wt 194.4 lb

## 2015-07-16 DIAGNOSIS — I10 Essential (primary) hypertension: Secondary | ICD-10-CM

## 2015-07-16 DIAGNOSIS — N529 Male erectile dysfunction, unspecified: Secondary | ICD-10-CM

## 2015-07-16 DIAGNOSIS — E7800 Pure hypercholesterolemia, unspecified: Secondary | ICD-10-CM

## 2015-07-16 NOTE — Progress Notes (Signed)
Patient ID: Derrick [name], male   DOB: 03/18/1951, 65 y.o.   MRN: [id number]    Cardiology Office Note    Date:  07/16/2015   ID:  Derrick [name], DOB 1950/10/30, MRN [id number]  PCP:  Lenise Herald, PA-C  Cardiologist:   Thurmon Fair, MD   Chief Complaint  Patient presents with  . Follow-up    no chest pain, no shortness of breath, no edema, pain or cramping in legs, none recent- lightheadedness or dizziness    History of Present Illness:  Derrick [name] is a 65 y.o. male with systemic hypertension, erectile dysfunction, hypercholesterolemia who presents with unusual complaints of left-sided dysesthesia and muscle tightness. He describes a sensation of numbness and muscle constriction over the left side of his neck, left shoulder and arm, left thorax and left thigh. He has been evaluated for possible neurological causes, both cerebral and spinal, apparently without any diagnosis. He describes his complaint as an unusual sensation of tightness of the skin over the underlying tissues. It is not related to exertion. It is not clearly positional, although changes in position make the symptoms standout. Hot weather clearly makes his symptoms worse and anxiolytics seemed to make them better. He wonders whether they may be a cardiac reason for his symptoms. MRI of the brain, MRA, carotid Doppler, echo were all nondiagnostic. Thoracic and cervical spine MRA were also unremarkable. Recalls tick bites at the onset of the disease, serological testing for several disorders including Lyme disease was negative. His neurosurgeon did not think that his symptoms were due to a spinal abnormality.  Dr. Anne Hahn expressed the possibility that he had a small brainstem stroke that was missed by MRI. He recommended continued treatment with aspirin and cholesterol lowering medications. Clopidogrel was recommended by Dr. Pearlean Brownie during the hospital stay.  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 He walks between 2 and 8 miles daily. If unable to walk he will bike, but has avoided the bicycle since his neurological complaints. He continues to have erectile dysfunction that responds well to phosphodiesterase inhibitors. He avoids Viagra because it causes a headache, but tolerates low-dose Cialis without any complications.  He stopped simvastatin because of the unusual muscular complaints, without any positive impact of the symptoms. His most recent lipid profile shows a total cholesterol of 210, HDL 47, LDL 142. He has lost some weight by avoiding carbohydrates.  Past Medical History  Diagnosis Date  . Hematuria   . Arthritis NECK  . S/P cervical spinal fusion   . Hypertension C[facility]IOLOGIST- [facility]    STRESS TEST YRS AGO-- OK  ; LAST VISIT 1 MONTH AGO  . Hypothyroidism   . History of prostatitis   . ED (erectile dysfunction)     intolerant to viagra and cialis  . Hyperlipidemia   . Palpitations     event monitor 03-2012-showed a single 6 beat run of NSVT; echo 05/25/12-EF55-60%, mild LVH  . Chest pain, atypical 2005    myoview 09/13/03-normal perfusion  . Vertigo 2005    nl carotid dopplers 08/09/2003  . Cervical radiculopathy   . TIA (transient ischemic attack)     Past Surgical History  Procedure Laterality Date  . Anterior cervical decomp/discectomy fusion  07-29-2000    C4 - C7  . Cystoscopy w/ ureteroscopy  20 YRS AGO  (APPROX. 1990'S)  . Cystoscopy w/ retrogrades  12/25/2011    Procedure: CYSTOSCOPY WITH RETROGRADE PYELOGRAM;  Surgeon: Marcine Matar, MD;  Location: [facility];  Service: Urology;  Laterality: Bilateral;  . Colonoscopy N/A 01/07/2015    Procedure: COLONOSCOPY;  Surgeon: Malissa Hippo, MD;  [facility];  Service: Endoscopy;  Laterality: N/A;  145    Outpatient Prescriptions Prior to Visit  Medication Sig Dispense Refill  . aspirin EC 81 MG tablet Take 162 mg by mouth daily.     .  bisoprolol-hydrochlorothiazide (ZIAC) 5-6.25 MG per tablet Take 1 tablet by mouth every evening. (Patient taking differently: Take 1 tablet by mouth 2 (two) times daily. ) 90 tablet 2  . CIALIS 5 MG tablet Take by mouth as needed.    . diazepam (VALIUM) 5 MG tablet take 1 tablet by mouth every 8 hours if needed for muscle spasm  0  . hydrocortisone (ANUSOL-HC) 25 MG suppository Place 25 mg rectally 2 (two) times daily.    Marland Kitchen levothyroxine (SYNTHROID, LEVOTHROID) 50 MCG tablet Take 50 mcg by mouth daily before breakfast.    . lisinopril (PRINIVIL,ZESTRIL) 5 MG tablet TAKE 1 TABLET (5 MG TOTAL) BY MOUTH DAILY. 90 tablet 3  . vardenafil (LEVITRA) 20 MG tablet Take 20 mg by mouth daily as needed for erectile dysfunction.    Marland Kitchen VIAGRA 100 MG tablet Take 100 mg by mouth as needed.     No facility-administered medications prior to visit.     Allergies:   Review of patient's allergies indicates no known allergies.   Social History   Social History  . Marital Status: Married    Spouse Name: N/A  . Number of Children: 2  . Years of Education: N/A   Occupational History  . retired    Social History Main Topics  . Smoking status: Never Smoker   . Smokeless tobacco: Never Used  . Alcohol Use: No  . Drug Use: No  . Sexual Activity: Not Asked   Other Topics Concern  . None   Social History Narrative   Patient drinks very little caffeine.   Patient is right handed.     Family History:  The patient's family history includes CVA in his father; Healthy in his sister, son, and son; Heart attack in his brother and mother.   ROS:   Please see the history of present illness.    ROS All other systems reviewed and are negative.   PHYSICAL EXAM:   VS:  BP 134/88 mmHg  Pulse 66  Ht  (1.778 m)  Wt 194 lb 6 oz (88.168 kg)  BMI 27.89 kg/m2   GEN: Well nourished, well developed, in no acute distress HEENT: normal Neck: no JVD, carotid bruits, or masses Cardiac: RRR; no murmurs, rubs, or  gallops,no edema  Respiratory:  clear to auscultation bilaterally, normal work of breathing GI: soft, nontender, nondistended, + BS MS: no deformity or atrophy Skin: warm and dry, no rash Neuro:  Alert and Oriented x 3, Strength and sensation are intact Psych: euthymic mood, full affect  Wt Readings from Last 3 Encounters:  07/16/15 194 lb 6 oz (88.168 kg)  02/12/15 191 lb 3.2 oz (86.728 kg)  12/11/14 188 lb 11.2 oz (85.594 kg)      Studies/Labs Reviewed:   EKG:  EKG is ordered today.  The ekg ordered today demonstrates NSR  Recent Labs: 12/10/2014: ALT 16* 12/11/2014: Hemoglobin 15.1; Platelets 233 12/12/2014: BUN 16; Creatinine, Ser 1.32*; Potassium 3.6; Sodium 140; TSH 3.017   Lipid Panel    Component Value Date/Time   CHOL 177 12/11/2014 0100  TRIG 135 12/11/2014 0100   HDL 29* 12/11/2014 0100   CHOLHDL 6.1 12/11/2014 0100   VLDL 27 12/11/2014 0100   LDLCALC 121* 12/11/2014 0100    Additional studies/ records that were reviewed today include:  Extensive notes from hospitalization in June and neurology office visit with Dr. Anne Hahn in August. Multiple imaging studies of his brain, spine and cerebrovascular circulation    ASSESSMENT:    1. Hypercholesterolemia   2. Essential hypertension, benign   3. Erectile dysfunction, unspecified erectile dysfunction type      PLAN:  In order of problems listed above:  1. Since there is suspicion that Asael's neurological complaints might represent a stroke, I strongly recommended that he consider restarting statin therapy. Target LDL should be less than 100. His most recent lipid profile in December had actually worsened compared with June and with an LDL of 142, statins are indicated. He would like to continue trying to improve his diet and recheck labs in 3 months. He is physically very active. I doubt that he will be able to make a big enough dent in his lipid numbers with diet, but will work with him. 2. Well-controlled  systemic hypertension, continue same medications 3. He requested samples of Viagra which we were able to provide today. He is aware that this medication should never be combined with nitrates    Medication Adjustments/Labs and Tests Ordered: Current medicines are reviewed at length with the patient today.  Concerns regarding medicines are outlined above.  Medication changes, Labs and Tests ordered today are listed in the Patient Instructions below. Patient Instructions  Your physician recommends that you return for lab work in: 3 MONTHS (MID TO LATE April) FASTING AT [facility].  Dr. Royann Shivers recommends that you schedule a follow-up appointment in: ONE YEAR         SignedThurmon Fair, MD  07/16/2015 5:36 PM    [facility] [address] [place], [place], [place]  [address] Phone: [phone]; Fax: [phone]

## 2015-07-16 NOTE — Patient Instructions (Signed)
Your physician recommends that you return for lab work in: 3 MONTHS (MID TO LATE April) FASTING AT [facility].  Dr. Royann Shivers recommends that you schedule a follow-up appointment in: ONE YEAR

## 2015-08-02 ENCOUNTER — Telehealth (INDEPENDENT_AMBULATORY_CARE_PROVIDER_SITE_OTHER): Payer: Self-pay | Admitting: Internal Medicine

## 2015-08-02 NOTE — Telephone Encounter (Signed)
Derrick [name] is scheduled with Dr. Karilyn Cota on February 21st, 2017 at 9:00am. He called back saying he has to take his grand-child to school at that time and is wondering if Dr. Karilyn Cota can "work him in" sometime that afternoon. I told him Dr. Cathie Beams' next available appointment is in May. He said Tuesday mornings just aren't good times for him. He'd like a phone call regarding this.  Pt's ph# [phone]  Thank you.

## 2015-08-05 ENCOUNTER — Ambulatory Visit: Payer: 59 | Admitting: Cardiovascular Disease

## 2015-08-07 NOTE — Telephone Encounter (Signed)
It appears he was rescheduled for 5/23 afternoon for 1 year follow up from cancelling 08/20/15 appt back on 2/3.  Is he calling back in to say now he wants to be seen on 2/21?  I am a little confused on what he wants now.  Dr. Karilyn Cota only see more emergent work ins on Thursday's.      I think instead of routing these to Tammy--just come to me or route to me as I am going to get this anyway.

## 2015-08-07 NOTE — Telephone Encounter (Signed)
What are you recommendations on this. Patient states that Tuesday's are not good for him.

## 2015-08-08 NOTE — Telephone Encounter (Signed)
He's been rescheduled to March 7th, in the afternoon per Reba. Thank you.

## 2015-08-20 ENCOUNTER — Ambulatory Visit (INDEPENDENT_AMBULATORY_CARE_PROVIDER_SITE_OTHER): Payer: 59 | Admitting: Internal Medicine

## 2015-08-26 DIAGNOSIS — Z029 Encounter for administrative examinations, unspecified: Secondary | ICD-10-CM | POA: Diagnosis not present

## 2015-09-02 DIAGNOSIS — H26491 Other secondary cataract, right eye: Secondary | ICD-10-CM | POA: Diagnosis not present

## 2015-09-02 DIAGNOSIS — H16221 Keratoconjunctivitis sicca, not specified as Sjogren's, right eye: Secondary | ICD-10-CM | POA: Diagnosis not present

## 2015-09-02 DIAGNOSIS — H26492 Other secondary cataract, left eye: Secondary | ICD-10-CM | POA: Diagnosis not present

## 2015-09-02 DIAGNOSIS — H16222 Keratoconjunctivitis sicca, not specified as Sjogren's, left eye: Secondary | ICD-10-CM | POA: Diagnosis not present

## 2015-09-03 ENCOUNTER — Ambulatory Visit (INDEPENDENT_AMBULATORY_CARE_PROVIDER_SITE_OTHER): Payer: Medicare Other | Admitting: Internal Medicine

## 2015-11-18 DIAGNOSIS — E039 Hypothyroidism, unspecified: Secondary | ICD-10-CM | POA: Diagnosis not present

## 2015-11-18 DIAGNOSIS — E785 Hyperlipidemia, unspecified: Secondary | ICD-10-CM | POA: Diagnosis not present

## 2015-11-18 DIAGNOSIS — Z125 Encounter for screening for malignant neoplasm of prostate: Secondary | ICD-10-CM | POA: Diagnosis not present

## 2015-11-18 DIAGNOSIS — Z1389 Encounter for screening for other disorder: Secondary | ICD-10-CM | POA: Diagnosis not present

## 2015-11-18 DIAGNOSIS — Z0000 Encounter for general adult medical examination without abnormal findings: Secondary | ICD-10-CM | POA: Diagnosis not present

## 2015-11-18 DIAGNOSIS — R202 Paresthesia of skin: Secondary | ICD-10-CM | POA: Diagnosis not present

## 2015-11-18 DIAGNOSIS — Z6826 Body mass index (BMI) 26.0-26.9, adult: Secondary | ICD-10-CM | POA: Diagnosis not present

## 2015-11-19 ENCOUNTER — Ambulatory Visit (INDEPENDENT_AMBULATORY_CARE_PROVIDER_SITE_OTHER): Payer: Medicare Other | Admitting: Internal Medicine

## 2015-11-19 ENCOUNTER — Telehealth: Payer: Self-pay | Admitting: Cardiovascular Disease

## 2015-11-19 NOTE — Telephone Encounter (Signed)
New message     Pt wanted results of blood work.

## 2015-11-19 NOTE — Telephone Encounter (Signed)
Returned call to patient. States he had blood work yesterday and has a copy of the results. He will fax them to us for review.

## 2015-11-21 ENCOUNTER — Telehealth: Payer: Self-pay | Admitting: Cardiovascular Disease

## 2015-11-21 DIAGNOSIS — E785 Hyperlipidemia, unspecified: Secondary | ICD-10-CM

## 2015-11-21 NOTE — Telephone Encounter (Signed)
New message      Per pt he sent his blood work to the office for the MD and nurse to look at.

## 2015-11-22 NOTE — Telephone Encounter (Signed)
Dr C reviewed labs received. Recommended starting Livalo 2 mg.   Returned call to patient. Patient stated that he'd rather make lifestyle changes than start a new medication at this time. States he has cut out "like 60% of the bread he'd been eating." Patient also thinks that this gradual increase may be due to taking Lisinopril. Patient states that he has noticed this since starting Lisinopril (started 11/28/2012).  Discussed this with Dr C.  Dr C agreed with lifestyle changes. Will recheck CHOL in 3 months. Lisinopril is likely not the cause for the increase in his cholesterol.

## 2015-11-26 NOTE — Telephone Encounter (Signed)
Called patient with Dr Renaye Rakers's recommendations. Patient verbalized understanding and agreed with plan.

## 2015-12-12 DIAGNOSIS — M5416 Radiculopathy, lumbar region: Secondary | ICD-10-CM | POA: Diagnosis not present

## 2015-12-12 DIAGNOSIS — M545 Low back pain: Secondary | ICD-10-CM | POA: Diagnosis not present

## 2015-12-18 DIAGNOSIS — M5416 Radiculopathy, lumbar region: Secondary | ICD-10-CM | POA: Diagnosis not present

## 2015-12-18 DIAGNOSIS — M5126 Other intervertebral disc displacement, lumbar region: Secondary | ICD-10-CM | POA: Diagnosis not present

## 2016-01-08 DIAGNOSIS — Z6827 Body mass index (BMI) 27.0-27.9, adult: Secondary | ICD-10-CM | POA: Diagnosis not present

## 2016-01-08 DIAGNOSIS — M5416 Radiculopathy, lumbar region: Secondary | ICD-10-CM | POA: Diagnosis not present

## 2016-02-12 ENCOUNTER — Telehealth: Payer: Self-pay | Admitting: Cardiovascular Disease

## 2016-02-12 NOTE — Telephone Encounter (Signed)
Spoke with patient. He states his insurance will not cover another lipid test for another 3 months. Advised this is OK, just to make sure he completes labs when able. He states he has made lifestyle & dietary changes, watches his carb intake and rides bike 15 miles/daily.

## 2016-02-12 NOTE — Telephone Encounter (Signed)
Pt wants to know if he can put off getting his Cholesterol check for another 3 mos.?His insurance will not pay for it at this time.

## 2016-02-17 DIAGNOSIS — E890 Postprocedural hypothyroidism: Secondary | ICD-10-CM | POA: Diagnosis not present

## 2016-03-25 DIAGNOSIS — Z23 Encounter for immunization: Secondary | ICD-10-CM | POA: Diagnosis not present

## 2016-04-13 DIAGNOSIS — N5201 Erectile dysfunction due to arterial insufficiency: Secondary | ICD-10-CM | POA: Diagnosis not present

## 2016-04-13 DIAGNOSIS — R972 Elevated prostate specific antigen [PSA]: Secondary | ICD-10-CM | POA: Diagnosis not present

## 2016-07-27 ENCOUNTER — Encounter: Payer: Self-pay | Admitting: Cardiovascular Disease

## 2016-07-27 ENCOUNTER — Ambulatory Visit (INDEPENDENT_AMBULATORY_CARE_PROVIDER_SITE_OTHER): Payer: Medicare Other | Admitting: Cardiovascular Disease

## 2016-07-27 VITALS — BP 136/70 | HR 63 | Ht 70.0 in | Wt 194.0 lb

## 2016-07-27 DIAGNOSIS — I472 Ventricular tachycardia: Secondary | ICD-10-CM

## 2016-07-27 DIAGNOSIS — E785 Hyperlipidemia, unspecified: Secondary | ICD-10-CM

## 2016-07-27 DIAGNOSIS — Z125 Encounter for screening for malignant neoplasm of prostate: Secondary | ICD-10-CM | POA: Diagnosis not present

## 2016-07-27 DIAGNOSIS — I10 Essential (primary) hypertension: Secondary | ICD-10-CM | POA: Diagnosis not present

## 2016-07-27 DIAGNOSIS — I4729 Other ventricular tachycardia: Secondary | ICD-10-CM

## 2016-07-27 LAB — COMPREHENSIVE METABOLIC PANEL
ALT: 13 U/L (ref 9–46)
AST: 16 U/L (ref 10–35)
Albumin: 4.3 g/dL (ref 3.6–5.1)
Alkaline Phosphatase: 46 U/L (ref 40–115)
BUN: 19 mg/dL (ref 7–25)
CHLORIDE: 103 mmol/L (ref 98–110)
CO2: 29 mmol/L (ref 20–31)
Calcium: 9.2 mg/dL (ref 8.6–10.3)
Creat: 1.16 mg/dL (ref 0.70–1.25)
GLUCOSE: 99 mg/dL (ref 65–99)
POTASSIUM: 4.6 mmol/L (ref 3.5–5.3)
Sodium: 141 mmol/L (ref 135–146)
Total Bilirubin: 0.9 mg/dL (ref 0.2–1.2)
Total Protein: 7.1 g/dL (ref 6.1–8.1)

## 2016-07-27 LAB — LIPID PANEL
CHOL/HDL RATIO: 4.6 ratio (ref <5.0)
Cholesterol: 176 mg/dL (ref <200)
HDL: 38 mg/dL — ABNORMAL LOW (ref >40)
LDL CALC: 121 mg/dL — AB (ref <100)
Triglycerides: 86 mg/dL (ref <150)
VLDL: 17 mg/dL (ref <30)

## 2016-07-27 LAB — TSH: TSH: 2.24 mIU/L (ref 0.40–4.50)

## 2016-07-27 LAB — PSA: PSA: 3.1 ng/mL (ref <=4.0)

## 2016-07-27 MED ORDER — BISOPROLOL-HYDROCHLOROTHIAZIDE 5-6.25 MG PO TABS: 1.0000 | ORAL_TABLET | Freq: Every evening | ORAL | 3 refills | Status: DC

## 2016-07-27 MED ORDER — LISINOPRIL 5 MG PO TABS: 5.0000 mg | ORAL_TABLET | Freq: Every day | ORAL | 3 refills | Status: DC

## 2016-07-27 NOTE — Patient Instructions (Addendum)
Dr Croitoru recommends that you continue on your current medications as directed. Please refer to the Current Medication list given to you today.  Your physician recommends that you return for lab work at your earliest convenience - FASTING.  Dr Croitoru recommends that you schedule a follow-up appointment in 1 year. You will receive a reminder letter in the mail two months in advance. If you don't receive a letter, please call our office to schedule the follow-up appointment.  If you need a refill on your cardiac medications before your next appointment, please call your pharmacy. 

## 2016-07-27 NOTE — Progress Notes (Signed)
Patient ID: Derrick [name], male   DOB: 1950/10/05, 66 y.o.   MRN: [id number]    Cardiology Office Note    Date:  07/28/2016   ID:  Derrick [name], DOB 1950-11-17, MRN [id number]  PCP:  Lenise Herald, PA-C  Cardiologist:   Thurmon Fair, MD   Chief Complaint  Patient presents with  . Follow-up    History of Present Illness:  Derrick [name] is a 66 y.o. male with systemic hypertension, erectile dysfunction, hypercholesterolemia who presents for routine follow-up. He has not had any interval complaints of chest pain, shortness of breath, intermittent claudication, leg edema or other vascular problems. He continues to experience problems with erectile dysfunction and complains of lack of sensation in his genital area that had onset at the same time as his other rather unusual neurological complaints (left-sided dysesthesia and sensation of muscle tightening) that may be caused by a small brainstem stroke. He has also had previous cervical spine surgery.  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 He has no exertional complaints.  He stopped simvastatin because of the unusual muscular complaints, without any positive impact of the symptoms. His neurologists recommended that he resume treatment with statins but he has yet to do so. His most recent lipid profile shows a total cholesterol of 210, HDL 47, LDL 142.   In 2013, a 30 day event monitor showed a 6 beat run of nonsustained VT. He has not had palpitations or syncope following that he had an echo that showed normal LV size and systolic function and normal valves and a nuclear stress test in 2005 was a normal test.   Past Medical History:  Diagnosis Date  . Arthritis NECK  . Cervical radiculopathy   . Chest pain, atypical 2005   myoview 09/13/03-normal perfusion  . ED (erectile dysfunction)    intolerant to viagra and cialis  . Hematuria   . History  of prostatitis   . Hyperlipidemia   . Hypertension CARDIOLOGIST- [facility]   STRESS TEST YRS AGO-- OK  ; LAST VISIT 1 MONTH AGO  . Hypothyroidism   . Palpitations    event monitor 03-2012-showed a single 6 beat run of NSVT; echo 05/25/12-EF55-60%, mild LVH  . S/P cervical spinal fusion   . TIA (transient ischemic attack)   . Vertigo 2005   nl carotid dopplers 08/09/2003    Past Surgical History:  Procedure Laterality Date  . ANTERIOR CERVICAL DECOMP/DISCECTOMY FUSION  07-29-2000   C4 - C7  . COLONOSCOPY N/A 01/07/2015   Procedure: COLONOSCOPY;  Surgeon: Malissa Hippo, MD;  [facility];  Service: Endoscopy;  Laterality: N/A;  145  . CYSTOSCOPY W/ RETROGRADES  12/25/2011   Procedure: CYSTOSCOPY WITH RETROGRADE PYELOGRAM;  Surgeon: Marcine Matar, MD;  Location: [facility];  Service: Urology;  Laterality: Bilateral;  . CYSTOSCOPY W/ URETEROSCOPY  20 YRS AGO  (APPROX. 1990'S)    Outpatient Medications Prior to Visit  Medication Sig Dispense Refill  . aspirin EC 81 MG tablet Take 162 mg by mouth daily.     Marland Kitchen CIALIS 5 MG tablet Take by mouth as needed.    . cyclobenzaprine (FLEXERIL) 10 MG tablet As needed  0  . diazepam (VALIUM) 5 MG tablet take 1 tablet by mouth every 8 hours if needed for muscle spasm  0  . hydrocortisone (ANUSOL-HC) 25 MG suppository Place 25 mg rectally  2 (two) times daily.    Marland Kitchen. levothyroxine (SYNTHROID, LEVOTHROID) 50 MCG tablet Take 50 mcg by mouth daily before breakfast.    . Polyethyl Glycol-Propyl Glycol (SYSTANE) 0.4-0.3 % SOLN Apply 1 drop to eye daily.    . vardenafil (LEVITRA) 20 MG tablet Take 20 mg by mouth daily as needed for erectile dysfunction.    Marland Kitchen. VIAGRA 100 MG tablet Take 100 mg by mouth as needed.    . bisoprolol-hydrochlorothiazide (ZIAC) 5-6.25 MG per tablet Take 1 tablet by mouth every evening. (Patient taking differently: Take 1 tablet by mouth 2 (two) times daily. ) 90 tablet 2  . lisinopril  (PRINIVIL,ZESTRIL) 5 MG tablet TAKE 1 TABLET (5 MG TOTAL) BY MOUTH DAILY. 90 tablet 3   No facility-administered medications prior to visit.      Allergies:   Patient has no known allergies.   Social History   Social History  . Marital status: Married    Spouse name: N/A  . Number of children: 2  . Years of education: N/A   Occupational History  . retired    Social History Main Topics  . Smoking status: Never Smoker  . Smokeless tobacco: Never Used  . Alcohol use No  . Drug use: No  . Sexual activity: Not Asked   Other Topics Concern  . None   Social History Narrative   Patient drinks very little caffeine.   Patient is right handed.     Family History:  The patient's family history includes CVA in his father; Healthy in his sister, son, and son; Heart attack in his brother and mother.   ROS:   Please see the history of present illness.    ROS All other systems reviewed and are negative.   PHYSICAL EXAM:   VS:  BP 136/70 (BP Location: Right Arm, Patient Position: Sitting, Cuff Size: Normal)   Pulse 63   Ht 5\' 10"  (1.778 m)   Wt 88 kg (194 lb)   BMI 27.84 kg/m    GEN: Well nourished, well developed, in no acute distress  HEENT: normal  Neck: no JVD, carotid bruits, or masses Cardiac: RRR; no murmurs, rubs, or gallops,no edema  Respiratory:  clear to auscultation bilaterally, normal work of breathing GI: soft, nontender, nondistended, + BS MS: no deformity or atrophy  Skin: warm and dry, no rash Neuro:  Alert and Oriented x 3, Strength and sensation are intact Psych: euthymic mood, full affect  Wt Readings from Last 3 Encounters:  07/27/16 88 kg (194 lb)  07/16/15 88.2 kg (194 lb 6 oz)  02/12/15 86.7 kg (191 lb 3.2 oz)      Studies/Labs Reviewed:   EKG:  EKG is ordered today.  The ekg ordered today demonstrates NSR. QTc 399 ms.  Recent Labs: 07/27/2016: ALT 13; BUN 19; Creat 1.16; Potassium 4.6; Sodium 141; TSH 2.24   Lipid Panel    Component  Value Date/Time   CHOL 176 07/27/2016 1019   TRIG 86 07/27/2016 1019   HDL 38 (L) 07/27/2016 1019   CHOLHDL 4.6 07/27/2016 1019   VLDL 17 07/27/2016 1019   LDLCALC 121 (H) 07/27/2016 1019    ASSESSMENT:    1. Dyslipidemia   2. Essential hypertension, benign   3. NSVT (nonsustained ventricular tachycardia) (HCC)   4. Screening PSA (prostate specific antigen)      PLAN:  In order of problems listed above:  1. HLP: Since there is suspicion that Billal's neurological complaints might represent a stroke, I strongly  recommended that he consider restarting statin therapy. He thinks he has made progress with his diet and exercise would like to recheck his labs before we start the medication. His is reasonable. Since Dwyane Luo no longer works at [employer] he has yet to get another PCP, he asked me to get his other routine labs including his PSA today. 2. HTN: Well-controlled systemic hypertension, continue same medications. 3. NSVT: Incidentally recorded a nuclear stress test, of doubtful clinical significance. He does not have evidence of structural heart disease. 4. HypoT4: Check his TSH with the other labs.    Medication Adjustments/Labs and Tests Ordered: Current medicines are reviewed at length with the patient today.  Concerns regarding medicines are outlined above.  Medication changes, Labs and Tests ordered today are listed in the Patient Instructions below. Patient Instructions  Dr Royann Shivers recommends that you continue on your current medications as directed. Please refer to the Current Medication list given to you today.  Your physician recommends that you return for lab work at your earliest convenience - FASTING.  Dr Royann Shivers recommends that you schedule a follow-up appointment in 1 year. You will receive a reminder letter in the mail two months in advance. If you don't receive a letter, please call our office to schedule the follow-up appointment.  If you need a refill  on your cardiac medications before your next appointment, please call your pharmacy.      Signed, Thurmon Fair, MD  07/28/2016 5:33 PM    [facility] [address] [place], [place], [place]  [address] Phone: [phone]; Fax: [phone]

## 2016-08-06 ENCOUNTER — Telehealth: Payer: Self-pay | Admitting: Cardiovascular Disease

## 2016-08-06 MED ORDER — ASPIRIN EC 81 MG PO TBEC: 162.0000 mg | DELAYED_RELEASE_TABLET | Freq: Every day | ORAL | 3 refills | Status: DC

## 2016-08-06 NOTE — Telephone Encounter (Signed)
New Message ° °Pt voiced wanting to speak with nurse. ° °Please f/u °

## 2016-08-06 NOTE — Telephone Encounter (Signed)
Returned call to patient He states he was in the office on Monday  He states [facility] pharmacy [facility] He can get aspirin for free from mail order pharmacy so he requested this to be refilled Rx(s) sent to pharmacy electronically.

## 2016-08-17 ENCOUNTER — Telehealth: Payer: Self-pay | Admitting: Cardiovascular Disease

## 2016-08-17 MED ORDER — ASPIRIN EC 81 MG PO TBEC: 162.0000 mg | DELAYED_RELEASE_TABLET | Freq: Every day | ORAL | 3 refills | Status: DC

## 2016-08-17 NOTE — Telephone Encounter (Signed)
Patient is calling in reference to [facility] Aspirin, states that [facility] charges $14. He would like to know if he could get a prescription for [facility] aspirin 81 mg sent to [facility] [address], [place], [place] [address] [phone]) [phone]. Thanks.

## 2016-08-17 NOTE — Telephone Encounter (Signed)
Refilled at patient request to local pharm. He's aware we also have the sample boxes of this medication, some of which I've provided at front desk for him to pick up at his convenience. Pt voiced understanding and thanks.

## 2016-11-12 ENCOUNTER — Telehealth: Payer: Self-pay | Admitting: Cardiovascular Disease

## 2016-11-12 MED ORDER — LISINOPRIL 5 MG PO TABS: 5.0000 mg | ORAL_TABLET | Freq: Every day | ORAL | 3 refills | Status: DC

## 2016-11-12 NOTE — Telephone Encounter (Signed)
New message     *STAT* If patient is at the pharmacy, call can be transferred to refill team.   1. Which medications need to be refilled? (please list name of each medication and dose if known)  lisinopril (PRINIVIL,ZESTRIL) 5 MG tablet Take 1 tablet (5 mg total) by mouth daily.     2. Which pharmacy/location (including street and city if local pharmacy) is medication to be sent to? [facility]   3. Do they need a 30 day or 90 day supply?  90

## 2017-02-15 DIAGNOSIS — E890 Postprocedural hypothyroidism: Secondary | ICD-10-CM | POA: Diagnosis not present

## 2017-03-02 ENCOUNTER — Encounter (INDEPENDENT_AMBULATORY_CARE_PROVIDER_SITE_OTHER): Payer: Self-pay | Admitting: Internal Medicine

## 2017-03-03 ENCOUNTER — Encounter (INDEPENDENT_AMBULATORY_CARE_PROVIDER_SITE_OTHER): Payer: Self-pay | Admitting: Internal Medicine

## 2017-03-09 ENCOUNTER — Other Ambulatory Visit: Payer: Self-pay

## 2017-03-09 MED ORDER — BISOPROLOL-HYDROCHLOROTHIAZIDE 5-6.25 MG PO TABS
1.0000 | ORAL_TABLET | Freq: Every evening | ORAL | 3 refills | Status: AC
Start: 2017-03-09 — End: ?

## 2017-03-22 ENCOUNTER — Telehealth: Payer: Self-pay | Admitting: Cardiovascular Disease

## 2017-03-22 MED ORDER — LEVOTHYROXINE SODIUM 50 MCG PO TABS: 50.0000 ug | ORAL_TABLET | Freq: Every day | ORAL | 0 refills | Status: DC

## 2017-03-22 NOTE — Telephone Encounter (Signed)
OK to refill 50 mcg levothyroxine for 30 days. MCr

## 2017-03-22 NOTE — Telephone Encounter (Signed)
Rx(s) sent to pharmacy electronically.  

## 2017-03-22 NOTE — Telephone Encounter (Signed)
New message        *STAT* If patient is at the pharmacy, call can be transferred to refill team.   1. Which medications need to be refilled? (please list name of each medication and dose if known)  Levothyroxine  2. Which pharmacy/location (including street and city if local pharmacy) is medication to be sent to? [facility] 3. Do they need a 30 day or 90 day supply? 90 day supply

## 2017-03-22 NOTE — Telephone Encounter (Signed)
Called patient as there was no record that Dr. Royann Shivers had refilled levothyroxine per chart review. He states that this med was refilled after his last visit. Advised I would route to MD/CMA. He also reports his PCP is returning back to [facility] and he will obtain future refills from him.

## 2017-03-31 DIAGNOSIS — Z23 Encounter for immunization: Secondary | ICD-10-CM | POA: Diagnosis not present

## 2017-04-07 DIAGNOSIS — N5201 Erectile dysfunction due to arterial insufficiency: Secondary | ICD-10-CM | POA: Diagnosis not present

## 2017-04-07 DIAGNOSIS — R972 Elevated prostate specific antigen [PSA]: Secondary | ICD-10-CM | POA: Diagnosis not present

## 2017-04-21 DIAGNOSIS — S41109A Unspecified open wound of unspecified upper arm, initial encounter: Secondary | ICD-10-CM | POA: Diagnosis not present

## 2017-04-21 DIAGNOSIS — E663 Overweight: Secondary | ICD-10-CM | POA: Diagnosis not present

## 2017-04-21 DIAGNOSIS — Z6827 Body mass index (BMI) 27.0-27.9, adult: Secondary | ICD-10-CM | POA: Diagnosis not present

## 2017-04-21 DIAGNOSIS — Z0001 Encounter for general adult medical examination with abnormal findings: Secondary | ICD-10-CM | POA: Diagnosis not present

## 2017-04-21 DIAGNOSIS — I10 Essential (primary) hypertension: Secondary | ICD-10-CM | POA: Diagnosis not present

## 2017-04-21 DIAGNOSIS — Z23 Encounter for immunization: Secondary | ICD-10-CM | POA: Diagnosis not present

## 2017-07-28 ENCOUNTER — Encounter: Payer: Self-pay | Admitting: Cardiovascular Disease

## 2017-07-29 DIAGNOSIS — M25511 Pain in right shoulder: Secondary | ICD-10-CM | POA: Diagnosis not present

## 2017-08-02 ENCOUNTER — Encounter: Payer: Self-pay | Admitting: Cardiovascular Disease

## 2017-08-02 ENCOUNTER — Other Ambulatory Visit: Payer: Self-pay | Admitting: Cardiovascular Disease

## 2017-08-02 ENCOUNTER — Ambulatory Visit (INDEPENDENT_AMBULATORY_CARE_PROVIDER_SITE_OTHER): Payer: Medicare Other | Admitting: Cardiovascular Disease

## 2017-08-02 VITALS — BP 118/78 | HR 61 | Ht 70.0 in | Wt 196.2 lb

## 2017-08-02 DIAGNOSIS — N529 Male erectile dysfunction, unspecified: Secondary | ICD-10-CM | POA: Diagnosis not present

## 2017-08-02 DIAGNOSIS — I472 Ventricular tachycardia: Secondary | ICD-10-CM | POA: Diagnosis not present

## 2017-08-02 DIAGNOSIS — E7800 Pure hypercholesterolemia, unspecified: Secondary | ICD-10-CM | POA: Diagnosis not present

## 2017-08-02 DIAGNOSIS — E038 Other specified hypothyroidism: Secondary | ICD-10-CM | POA: Diagnosis not present

## 2017-08-02 DIAGNOSIS — R350 Frequency of micturition: Secondary | ICD-10-CM

## 2017-08-02 DIAGNOSIS — I10 Essential (primary) hypertension: Secondary | ICD-10-CM

## 2017-08-02 DIAGNOSIS — I4729 Other ventricular tachycardia: Secondary | ICD-10-CM

## 2017-08-02 DIAGNOSIS — Z79899 Other long term (current) drug therapy: Secondary | ICD-10-CM | POA: Diagnosis not present

## 2017-08-02 MED ORDER — CIALIS 5 MG PO TABS: 5.0000 mg | ORAL_TABLET | ORAL | 0 refills | Status: DC | PRN

## 2017-08-02 NOTE — Progress Notes (Signed)
Patient ID: Derrick [name], male   DOB: [birth year], 67 y.o.   MRN: [id number]    Cardiology Office Note    Date:  08/02/2017   ID:  [name], [name] 12/06/1950, MRN [id number]  PCP:  Shawnie Dapper, PA-C  Cardiologist:   Thurmon Fair, MD   Chief Complaint  Patient presents with  . Follow-up    pt reports occas "flutters" in his chest, he only notices it at night. no CP or ShOB.    History of Present Illness:  Derrick [name] is a 67 y.o. male with systemic hypertension, hypercholesterolemia, erectile dysfunction, who presents for routine follow-up.   Generally feels very well.  He walks about 5 miles every other day.  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  He has no complaints of exertional dyspnea or angina, focal neurological complaints, claudication, leg edema, syncope or sustained palpitations.  He has occasional isolated fluttering in his chest, usually at rest for bedtime.  He has gained just a little bit of weight since last year and remains in overweight range.  He continues to have erectile dysfunction that responds well to treatment with generic Cialis.  Has pollakiuria.  He is not taking a statin.  Last year his LDL cholesterol was 121.  In 2013, he had an echo that showed normal LV size and systolic function and normal valves. A nuclear stress test in 2005 was a normal test, but he did have brief nonsustained VT during the ECG portion of the test..  In 2013 his Holter monitor showed a single 6 beat run of nonsustained VT.   Past Medical History:  Diagnosis Date  . Arthritis NECK  . Cervical radiculopathy   . Chest pain, atypical 2005   myoview 09/13/03-normal perfusion  . ED (erectile dysfunction)    intolerant to viagra and cialis  . Hematuria   . History of prostatitis   . Hyperlipidemia   . Hypertension CARDIOLOGIST- [facility]   STRESS TEST YRS AGO-- OK  ; LAST VISIT 1 MONTH AGO  . Hypothyroidism   . Palpitations    event monitor  03-2012-showed a single 6 beat run of NSVT; echo 05/25/12-EF55-60%, mild LVH  . S/P cervical spinal fusion   . TIA (transient ischemic attack)   . Vertigo 2005   nl carotid dopplers 08/09/2003    Past Surgical History:  Procedure Laterality Date  . ANTERIOR CERVICAL DECOMP/DISCECTOMY FUSION  07-29-2000   C4 - C7  . COLONOSCOPY N/A 01/07/2015   Procedure: COLONOSCOPY;  Surgeon: Malissa Hippo, MD;  [facility];  Service: Endoscopy;  Laterality: N/A;  145  . CYSTOSCOPY W/ RETROGRADES  12/25/2011   Procedure: CYSTOSCOPY WITH RETROGRADE PYELOGRAM;  Surgeon: Marcine Matar, MD;  Location: [facility];  Service: Urology;  Laterality: Bilateral;  . CYSTOSCOPY W/ URETEROSCOPY  20 YRS AGO  (APPROX. 1990'S)    Outpatient Medications Prior to Visit  Medication Sig Dispense Refill  . aspirin EC 81 MG tablet Take 2 tablets (162 mg total) by mouth daily. 180 tablet 3  . bisoprolol-hydrochlorothiazide (ZIAC) 5-6.25 MG tablet Take 1 tablet by mouth every evening. 90 tablet 3  . cyclobenzaprine (FLEXERIL) 10 MG tablet Take 10 mg by mouth 3 (three) times daily as needed for muscle spasms. As needed   0  . diazepam (VALIUM) 5 MG tablet take 1 tablet by mouth every 8 hours if needed for muscle spasm  0  . hydrocortisone (ANUSOL-HC) 25  MG suppository Place 25 mg rectally 2 (two) times daily.    Marland Kitchen. levothyroxine (SYNTHROID, LEVOTHROID) 50 MCG tablet Take 1 tablet (50 mcg total) by mouth daily before breakfast. 30 tablet 0  . lisinopril (PRINIVIL,ZESTRIL) 5 MG tablet Take 1 tablet (5 mg total) by mouth daily. 90 tablet 3  . Polyethyl Glycol-Propyl Glycol (SYSTANE) 0.4-0.3 % SOLN Apply 1 drop to eye daily.    . vardenafil (LEVITRA) 20 MG tablet Take 20 mg by mouth daily as needed for erectile dysfunction.    Marland Kitchen. VIAGRA 100 MG tablet Take 100 mg by mouth as needed.    Marland Kitchen. CIALIS 5 MG tablet Take by mouth as needed.     No facility-administered medications prior to visit.       Allergies:   Patient has no known allergies.   Social History   Socioeconomic History  . Marital status: Married    Spouse name: None  . Number of children: 2  . Years of education: None  . Highest education level: None  Social Needs  . Financial resource strain: None  . Food insecurity - worry: None  . Food insecurity - inability: None  . Transportation needs - medical: None  . Transportation needs - non-medical: None  Occupational History  . Occupation: retired  Tobacco Use  . Smoking status: Never Smoker  . Smokeless tobacco: Never Used  Substance and Sexual Activity  . Alcohol use: No    Alcohol/week: 0.0 oz  . Drug use: No  . Sexual activity: None  Other Topics Concern  . None  Social History Narrative   Patient drinks very little caffeine.   Patient is right handed.     Family History:  The patient's family history includes CVA in his father; Healthy in his sister, son, and son; Heart attack in his brother and mother.   ROS:   Please see the history of present illness.    ROS All other systems reviewed and are negative.   PHYSICAL EXAM:   VS:  BP 118/78   Pulse 61   Ht 5\' 10"  (1.778 m)   Wt 196 lb 3.2 oz (89 kg)   BMI 28.15 kg/m     General: Alert, oriented x3, no distress, he is overweight, but also appears muscular and very fit for his age Head: no evidence of trauma, PERRL, EOMI, no exophtalmos or lid lag, no myxedema, no xanthelasma; normal ears, nose and oropharynx Neck: normal jugular venous pulsations and no hepatojugular reflux; brisk carotid pulses without delay and no carotid bruits Chest: clear to auscultation, no signs of consolidation by percussion or palpation, normal fremitus, symmetrical and full respiratory excursions Cardiovascular: normal position and quality of the apical impulse, regular rhythm, normal first and second heart sounds, no murmurs, rubs or gallops Abdomen: no tenderness or distention, no masses by palpation, no abnormal  pulsatility or arterial bruits, normal bowel sounds, no hepatosplenomegaly Extremities: no clubbing, cyanosis or edema; 2+ radial, ulnar and brachial pulses bilaterally; 2+ right femoral, posterior tibial and dorsalis pedis pulses; 2+ left femoral, posterior tibial and dorsalis pedis pulses; no subclavian or femoral bruits Neurological: grossly nonfocal Psych: Normal mood and affect'  Wt Readings from Last 3 Encounters:  08/02/17 196 lb 3.2 oz (89 kg)  07/27/16 194 lb (88 kg)  07/16/15 194 lb 6 oz (88.2 kg)      Studies/Labs Reviewed:   EKG:  EKG is ordered today.  The ekg ordered today demonstrates normal sinus rhythm, normal tracing Recent Labs: No  results found for requested labs within last 8760 hours.   Lipid Panel    Component Value Date/Time   CHOL 176 07/27/2016 1019   TRIG 86 07/27/2016 1019   HDL 38 (L) 07/27/2016 1019   CHOLHDL 4.6 07/27/2016 1019   VLDL 17 07/27/2016 1019   LDLCALC 121 (H) 07/27/2016 1019    ASSESSMENT:    1. Frequent urination   2. Hypercholesterolemia   3. Other specified hypothyroidism   4. Medication management   5. NSVT (nonsustained ventricular tachycardia) (HCC)      PLAN:  In order of problems listed above:  1. HLP: Repeat lipid profile.  Congratulated him on his very active lifestyle.  He is also eating a relatively healthy diet by his report.  He does not have known coronary artery vascular disease and has had a questionable TIA in the past. 2. HTN: Excellent control, no change to medications. 3. NSVT: This has been detected infrequently in the past.  He does not have evidence of structural heart disease.  He is on a beta-blocker.  Palpitations are infrequent and mild.  No change in therapy is necessary. 4. HypoT4: Check his TSH today.  He also requests screening for PSA, since we are drawing blood. 5. ED: Good response to PDE 5 inhibitors.  Reminded him never to take nitrates with his medications.    Medication Adjustments/Labs  and Tests Ordered: Current medicines are reviewed at length with the patient today.  Concerns regarding medicines are outlined above.  Medication changes, Labs and Tests ordered today are listed in the Patient Instructions below. Patient Instructions  Dr Royann Shivers recommends that you continue on your current medications as directed. Please refer to the Current Medication list given to you today.  Your physician recommends that you return for lab work TODAY.  Dr Royann Shivers recommends that you schedule a follow-up appointment in 12 months. You will receive a reminder letter in the mail two months in advance. If you don't receive a letter, please call our office to schedule the follow-up appointment.  If you need a refill on your cardiac medications before your next appointment, please call your pharmacy.      Signed, Thurmon Fair, MD  08/02/2017 8:29 AM    [facility] [address] [place], [place], [place]  [address] Phone: [phone]; Fax: [phone]

## 2017-08-02 NOTE — Patient Instructions (Signed)
Dr Croitoru recommends that you continue on your current medications as directed. Please refer to the Current Medication list given to you today.  Your physician recommends that you return for lab work TODAY.  Dr Croitoru recommends that you schedule a follow-up appointment in 12 months. You will receive a reminder letter in the mail two months in advance. If you don't receive a letter, please call our office to schedule the follow-up appointment.  If you need a refill on your cardiac medications before your next appointment, please call your pharmacy. 

## 2017-08-03 LAB — LIPID PANEL
CHOL/HDL RATIO: 4.8 ratio (ref 0.0–5.0)
Cholesterol, Total: 193 mg/dL (ref 100–199)
HDL: 40 mg/dL (ref >39)
LDL Calculated: 135 mg/dL — ABNORMAL HIGH (ref 0–99)
Triglycerides: 88 mg/dL (ref 0–149)
VLDL CHOLESTEROL CAL: 18 mg/dL (ref 5–40)

## 2017-08-03 LAB — COMPREHENSIVE METABOLIC PANEL
ALBUMIN: 4.5 g/dL (ref 3.6–4.8)
ALK PHOS: 53 IU/L (ref 39–117)
ALT: 20 IU/L (ref 0–44)
AST: 20 IU/L (ref 0–40)
Albumin/Globulin Ratio: 1.7 (ref 1.2–2.2)
BUN / CREAT RATIO: 17 (ref 10–24)
BUN: 20 mg/dL (ref 8–27)
Bilirubin Total: 0.7 mg/dL (ref 0.0–1.2)
CALCIUM: 9.3 mg/dL (ref 8.6–10.2)
CO2: 25 mmol/L (ref 20–29)
CREATININE: 1.16 mg/dL (ref 0.76–1.27)
Chloride: 100 mmol/L (ref 96–106)
GFR calc Af Amer: 75 mL/min/{1.73_m2} (ref >59)
GFR calc non Af Amer: 65 mL/min/{1.73_m2} (ref >59)
GLUCOSE: 101 mg/dL — AB (ref 65–99)
Globulin, Total: 2.6 g/dL (ref 1.5–4.5)
Potassium: 4.7 mmol/L (ref 3.5–5.2)
Sodium: 141 mmol/L (ref 134–144)
TOTAL PROTEIN: 7.1 g/dL (ref 6.0–8.5)

## 2017-08-03 LAB — CBC
HEMATOCRIT: 46.1 % (ref 37.5–51.0)
Hemoglobin: 16.4 g/dL (ref 13.0–17.7)
MCH: 30 pg (ref 26.6–33.0)
MCHC: 35.6 g/dL (ref 31.5–35.7)
MCV: 84 fL (ref 79–97)
Platelets: 266 10*3/uL (ref 150–379)
RBC: 5.46 x10E6/uL (ref 4.14–5.80)
RDW: 13.9 % (ref 12.3–15.4)
WBC: 10.3 10*3/uL (ref 3.4–10.8)

## 2017-08-03 LAB — TSH: TSH: 2.59 u[IU]/mL (ref 0.450–4.500)

## 2017-08-03 LAB — PSA: Prostate Specific Ag, Serum: 3.4 ng/mL (ref 0.0–4.0)

## 2017-08-16 DIAGNOSIS — E039 Hypothyroidism, unspecified: Secondary | ICD-10-CM | POA: Diagnosis not present

## 2017-08-16 DIAGNOSIS — R202 Paresthesia of skin: Secondary | ICD-10-CM | POA: Diagnosis not present

## 2017-08-16 DIAGNOSIS — I10 Essential (primary) hypertension: Secondary | ICD-10-CM | POA: Diagnosis not present

## 2017-08-16 DIAGNOSIS — M25511 Pain in right shoulder: Secondary | ICD-10-CM | POA: Diagnosis not present

## 2017-08-17 ENCOUNTER — Ambulatory Visit (INDEPENDENT_AMBULATORY_CARE_PROVIDER_SITE_OTHER): Payer: Medicare Other | Admitting: Internal Medicine

## 2017-08-18 DIAGNOSIS — Z23 Encounter for immunization: Secondary | ICD-10-CM | POA: Diagnosis not present

## 2017-08-18 DIAGNOSIS — Z579 Occupational exposure to unspecified risk factor: Secondary | ICD-10-CM | POA: Diagnosis not present

## 2017-08-22 ENCOUNTER — Other Ambulatory Visit: Payer: Self-pay | Admitting: Cardiovascular Disease

## 2017-09-04 ENCOUNTER — Other Ambulatory Visit: Payer: Self-pay | Admitting: Cardiovascular Disease

## 2017-09-22 DIAGNOSIS — Z23 Encounter for immunization: Secondary | ICD-10-CM | POA: Diagnosis not present

## 2017-10-01 DIAGNOSIS — M25511 Pain in right shoulder: Secondary | ICD-10-CM | POA: Diagnosis not present

## 2017-12-03 DIAGNOSIS — Z6827 Body mass index (BMI) 27.0-27.9, adult: Secondary | ICD-10-CM | POA: Diagnosis not present

## 2017-12-03 DIAGNOSIS — M542 Cervicalgia: Secondary | ICD-10-CM | POA: Diagnosis not present

## 2017-12-03 DIAGNOSIS — I10 Essential (primary) hypertension: Secondary | ICD-10-CM | POA: Diagnosis not present

## 2017-12-21 ENCOUNTER — Ambulatory Visit (INDEPENDENT_AMBULATORY_CARE_PROVIDER_SITE_OTHER): Payer: Medicare Other | Admitting: Internal Medicine

## 2018-01-03 DIAGNOSIS — M25511 Pain in right shoulder: Secondary | ICD-10-CM | POA: Diagnosis not present

## 2018-02-14 DIAGNOSIS — E890 Postprocedural hypothyroidism: Secondary | ICD-10-CM | POA: Diagnosis not present

## 2018-02-22 ENCOUNTER — Ambulatory Visit (INDEPENDENT_AMBULATORY_CARE_PROVIDER_SITE_OTHER): Payer: Medicare Other | Admitting: Internal Medicine

## 2018-02-22 ENCOUNTER — Encounter (INDEPENDENT_AMBULATORY_CARE_PROVIDER_SITE_OTHER): Payer: Self-pay | Admitting: Internal Medicine

## 2018-02-22 VITALS — BP 130/76 | HR 74 | Temp 97.8°F | Resp 18 | Ht 70.0 in | Wt 193.8 lb

## 2018-02-22 DIAGNOSIS — Z23 Encounter for immunization: Secondary | ICD-10-CM | POA: Diagnosis not present

## 2018-02-22 DIAGNOSIS — Z86010 Personal history of colonic polyps: Secondary | ICD-10-CM

## 2018-02-22 NOTE — Patient Instructions (Signed)
Next colonoscopy in July 2023. Call if you have change in bowel habits or rectal bleeding

## 2018-02-22 NOTE — Progress Notes (Signed)
Presenting complaint;  History of colonic adenoma and left-sided abdominal pain.  Subjective:  Patient is 67 year old Caucasian male who is here for scheduled visit.  He had colonoscopy in July 2016 with removal of single small adenoma.  He wants to know if he can have his colonoscopy earlier than 10 years.  He is not having any bowel issues.  His bowels move daily or every other day.  He has not experienced melena or rectal bleeding.  He previously has been evaluated for left-sided abdominal pain felt to be musculoskeletal.  He has not had that pain recently.  He continues to feel some discomfort involving left half of his body.  He recalls this pain started when he had what sounds like Lovenox injection during his hospitalization few years ago for syncope. He remains very active.  He walks an average of 4 miles every day.   Family history is negative for CRC.  Current Medications: Outpatient Encounter Medications as of 02/22/2018  Medication Sig  . aspirin 81 MG EC tablet TAKE 2 TABLETS BY MOUTH ONCE DAILY  . bisoprolol-hydrochlorothiazide (ZIAC) 5-6.25 MG tablet Take 1 tablet by mouth every evening.  Marland Kitchen CIALIS 5 MG tablet Take 1 tablet (5 mg total) by mouth as needed.  . diazepam (VALIUM) 5 MG tablet take 1 tablet by mouth every 8 hours if needed for muscle spasm  . levothyroxine (SYNTHROID, LEVOTHROID) 50 MCG tablet Take 1 tablet (50 mcg total) by mouth daily before breakfast.  . lisinopril (PRINIVIL,ZESTRIL) 5 MG tablet Take 1 tablet (5 mg total) by mouth daily.  . [DISCONTINUED] cyclobenzaprine (FLEXERIL) 10 MG tablet Take 10 mg by mouth 3 (three) times daily as needed for muscle spasms. As needed   . [DISCONTINUED] hydrocortisone (ANUSOL-HC) 25 MG suppository Place 25 mg rectally 2 (two) times daily.  . [DISCONTINUED] Polyethyl Glycol-Propyl Glycol (SYSTANE) 0.4-0.3 % SOLN Apply 1 drop to eye daily.  . [DISCONTINUED] vardenafil (LEVITRA) 20 MG tablet Take 20 mg by mouth daily as needed for  erectile dysfunction.  . [DISCONTINUED] VIAGRA 100 MG tablet Take 100 mg by mouth as needed.   No facility-administered encounter medications on file as of 02/22/2018.      Objective: Blood pressure 130/76, pulse 74, temperature 97.8 F (36.6 C), temperature source Oral, resp. rate 18, height 5\' 10"  (1.778 m), weight 193 lb 12.8 oz (87.9 kg). Patient is alert and in no acute distress. Conjunctiva is pink. Sclera is nonicteric Oropharyngeal mucosa is normal. No neck masses or thyromegaly noted. Cardiac exam with regular rhythm normal S1 and S2. No murmur or gallop noted. Lungs are clear to auscultation. Abdomen is symmetrical soft and nontender with organomegaly or masses. No LE edema or clubbing noted.  Labs/studies Results: Lab data from 08/02/2017  WBC 10.3, H&H 16.4 and 46.1 and platelet count 266K. Glucose 101 BUN 20, creatinine 1.16 Sodium 141 potassium 4.7, chloride 100, CO2 25 Serum calcium 9.3. Bilirubin 0.7, AP 53, AST 20, ALT 20 total protein 7.1 and albumin 4.5.   Assessment:  #1.  History of small colonic adenoma.  Last colonoscopy was in July 2016.  He does not have any worrisome symptoms.  His family history is negative for polyps or carcinoma.  Unless he develops new symptoms I do not believe there is any need for him to undergo colonoscopy until 2023.  #2.  History of left-sided abdominal pain felt to be musculoskeletal.  He has not experienced this pain lately.  Plan:  Office visit as needed. Surveillance colonoscopy in  July 2023. Patient to call office if he experiences rectal bleeding or change in bowel habits.

## 2018-03-19 DIAGNOSIS — Z23 Encounter for immunization: Secondary | ICD-10-CM | POA: Diagnosis not present

## 2018-03-23 DIAGNOSIS — M25511 Pain in right shoulder: Secondary | ICD-10-CM | POA: Diagnosis not present

## 2018-04-11 DIAGNOSIS — M25511 Pain in right shoulder: Secondary | ICD-10-CM | POA: Diagnosis not present

## 2018-04-13 DIAGNOSIS — M75111 Incomplete rotator cuff tear or rupture of right shoulder, not specified as traumatic: Secondary | ICD-10-CM | POA: Diagnosis not present

## 2018-04-13 DIAGNOSIS — N5201 Erectile dysfunction due to arterial insufficiency: Secondary | ICD-10-CM | POA: Diagnosis not present

## 2018-04-13 DIAGNOSIS — R972 Elevated prostate specific antigen [PSA]: Secondary | ICD-10-CM | POA: Diagnosis not present

## 2018-05-25 DIAGNOSIS — R05 Cough: Secondary | ICD-10-CM | POA: Diagnosis not present

## 2018-05-25 DIAGNOSIS — Z0000 Encounter for general adult medical examination without abnormal findings: Secondary | ICD-10-CM | POA: Diagnosis not present

## 2018-05-25 DIAGNOSIS — Z1389 Encounter for screening for other disorder: Secondary | ICD-10-CM | POA: Diagnosis not present

## 2018-05-25 DIAGNOSIS — Z0001 Encounter for general adult medical examination with abnormal findings: Secondary | ICD-10-CM | POA: Diagnosis not present

## 2018-05-25 DIAGNOSIS — Z681 Body mass index (BMI) 19 or less, adult: Secondary | ICD-10-CM | POA: Diagnosis not present

## 2018-05-25 DIAGNOSIS — E785 Hyperlipidemia, unspecified: Secondary | ICD-10-CM | POA: Diagnosis not present

## 2018-07-01 DIAGNOSIS — M542 Cervicalgia: Secondary | ICD-10-CM | POA: Diagnosis not present

## 2018-07-01 DIAGNOSIS — Z6827 Body mass index (BMI) 27.0-27.9, adult: Secondary | ICD-10-CM | POA: Diagnosis not present

## 2018-08-05 ENCOUNTER — Encounter: Payer: Self-pay | Admitting: Cardiovascular Disease

## 2018-08-05 ENCOUNTER — Ambulatory Visit: Payer: PPO | Admitting: Cardiovascular Disease

## 2018-08-05 VITALS — BP 122/60 | HR 60 | Ht 70.0 in | Wt 193.8 lb

## 2018-08-05 DIAGNOSIS — E785 Hyperlipidemia, unspecified: Secondary | ICD-10-CM

## 2018-08-05 DIAGNOSIS — I4729 Other ventricular tachycardia: Secondary | ICD-10-CM

## 2018-08-05 DIAGNOSIS — I472 Ventricular tachycardia: Secondary | ICD-10-CM | POA: Diagnosis not present

## 2018-08-05 DIAGNOSIS — E039 Hypothyroidism, unspecified: Secondary | ICD-10-CM | POA: Diagnosis not present

## 2018-08-05 DIAGNOSIS — N522 Drug-induced erectile dysfunction: Secondary | ICD-10-CM

## 2018-08-05 DIAGNOSIS — I10 Essential (primary) hypertension: Secondary | ICD-10-CM

## 2018-08-05 NOTE — Patient Instructions (Signed)
Medication Instructions:  The current medical regimen is effective;  continue present plan and medications.  If you need a refill on your cardiac medications before your next appointment, please call your pharmacy.    Follow-Up: At [facility], you and your health needs are our priority.  As part of our continuing mission to provide you with exceptional heart care, we have created designated Provider Care Teams.  These Care Teams include your primary Cardiologist (physician) and Advanced Practice Providers (APPs -  Physician Assistants and Nurse Practitioners) who all work together to provide you with the care you need, when you need it. You will need a follow up appointment in 12 months.  Please call our office 2 months in advance to schedule this appointment.  You may see Mihai Croitoru, MD or one of the following Advanced Practice Providers on your designated Care Team: Hao Meng, PA-C . Angela Duke, PA-C    

## 2018-08-05 NOTE — Progress Notes (Signed)
Patient ID: Derrick [name], male   DOB: March 14, 1951, 68 y.o.   MRN: [id number]    Cardiology Office Note    Date:  08/05/2018   ID:  Derrick, [name] 29-Nov-1950, MRN [id number]  PCP:  Derrick Dapper, PA-C  Cardiologist:   Thurmon Fair, MD   Chief Complaint  Patient presents with  . Hyperlipidemia  . Hypertension    History of Present Illness:  Derrick [name] is a 68 y.o. male with systemic hypertension, hypercholesterolemia, erectile dysfunction, who presents for routine follow-up.   But we set a great year without any new health problems.  He walks every day.  He alternates a more intense workout of 8 miles in 2 hours with a slower day of 3 miles in 1 hour.  In the summer he will ride his bicycle.  He rarely has mild orthostatic hypotension in the evenings.  He has occasional muscle cramps after more intense workouts.  The patient specifically denies any chest pain at rest exertion, dyspnea at rest or with exertion, orthopnea, paroxysmal nocturnal dyspnea, syncope, palpitations, focal neurological deficits, intermittent claudication, lower extremity edema, unexplained weight gain, cough, hemoptysis or wheezing.  He is not taking a statin.  His LDL cholesterol has improved and is now down to 109 with lifestyle changes only.  He has a chronically low HDL cholesterol, most recently measured at 35.  In 2013, he had an echo that showed normal LV size and systolic function and normal valves. A nuclear stress test in 2005 was a normal test, but he did have brief nonsustained VT during the ECG portion of the test..  In 2013 his Holter monitor showed a single 6 beat run of nonsustained VT.   Past Medical History:  Diagnosis Date  . Arthritis NECK  . Cervical radiculopathy   . Chest pain, atypical 2005   myoview 09/13/03-normal perfusion  . ED (erectile dysfunction)    intolerant to viagra and cialis  . Hematuria   . History of prostatitis   . Hyperlipidemia   . Hypertension CARDIOLOGIST-  [facility]   STRESS TEST YRS AGO-- OK  ; LAST VISIT 1 MONTH AGO  . Hypothyroidism   . Palpitations    event monitor 03-2012-showed a single 6 beat run of NSVT; echo 05/25/12-EF55-60%, mild LVH  . S/P cervical spinal fusion   . TIA (transient ischemic attack)   . Vertigo 2005   nl carotid dopplers 08/09/2003    Past Surgical History:  Procedure Laterality Date  . ANTERIOR CERVICAL DECOMP/DISCECTOMY FUSION  07-29-2000   C4 - C7  . COLONOSCOPY N/A 01/07/2015   Procedure: COLONOSCOPY;  Surgeon: Malissa Hippo, MD;  [facility];  Service: Endoscopy;  Laterality: N/A;  145  . CYSTOSCOPY W/ RETROGRADES  12/25/2011   Procedure: CYSTOSCOPY WITH RETROGRADE PYELOGRAM;  Surgeon: Marcine Matar, MD;  Location[facility];  Service: Urology;  Laterality: Bilateral;  . CYSTOSCOPY W/ URETEROSCOPY  20 YRS AGO  (APPROX. 1990'S)    Outpatient Medications Prior to Visit  Medication Sig Dispense Refill  . aspirin 81 MG EC tablet TAKE 2 TABLETS BY MOUTH ONCE DAILY 180 tablet 3  . bisoprolol-hydrochlorothiazide (ZIAC) 5-6.25 MG tablet Take 1 tablet by mouth every evening. 90 tablet 3  . CIALIS 5 MG tablet Take 1 tablet (5 mg total) by mouth as needed. 30 tablet 0  . diazepam (VALIUM) 5 MG tablet take 1 tablet by mouth every 8 hours if needed for muscle spasm  0  . levothyroxine (SYNTHROID, LEVOTHROID) 50 MCG tablet Take 1 tablet (50 mcg total) by mouth daily before breakfast. 30 tablet 0  . lisinopril (PRINIVIL,ZESTRIL) 5 MG tablet Take 1 tablet (5 mg total) by mouth daily. 90 tablet 3   No facility-administered medications prior to visit.      Allergies:   Patient has no known allergies.   Social History   Socioeconomic History  . Marital status: Married    Spouse name: Not on file  . Number of children: 2  . Years of education: Not on file  . Highest education level: Not on file  Occupational History  . Occupation: retired  Engineer, productionocial Needs  . Financial  resource strain: Not on file  . Food insecurity:    Worry: Not on file    Inability: Not on file  . Transportation needs:    Medical: Not on file    Non-medical: Not on file  Tobacco Use  . Smoking status: Never Smoker  . Smokeless tobacco: Never Used  Substance and Sexual Activity  . Alcohol use: No    Alcohol/week: 0.0 standard drinks  . Drug use: No  . Sexual activity: Not on file  Lifestyle  . Physical activity:    Days per week: Not on file    Minutes per session: Not on file  . Stress: Not on file  Relationships  . Social connections:    Talks on phone: Not on file    Gets together: Not on file    Attends religious service: Not on file    Active member of club or organization: Not on file    Attends meetings of clubs or organizations: Not on file    Relationship status: Not on file  Other Topics Concern  . Not on file  Social History Narrative   Patient drinks very little caffeine.   Patient is right handed.     Family History:  The patient's family history includes CVA in his father; Healthy in his sister, son, and son; Heart attack in his brother and mother.   ROS:   Please see the history of present illness.    ROS All other systems reviewed and are negative.   PHYSICAL EXAM:   VS:  BP 122/60   Pulse 60   Ht 5\' 10"  (1.778 m)   Wt 193 lb 12.8 oz (87.9 kg)   BMI 27.81 kg/m      General: Alert, oriented x3, no distress, looks very fit Head: no evidence of trauma, PERRL, EOMI, no exophtalmos or lid lag, no myxedema, no xanthelasma; normal ears, nose and oropharynx Neck: normal jugular venous pulsations and no hepatojugular reflux; brisk carotid pulses without delay and no carotid bruits Chest: clear to auscultation, no signs of consolidation by percussion or palpation, normal fremitus, symmetrical and full respiratory excursions Cardiovascular: normal position and quality of the apical impulse, regular rhythm, normal first and second heart sounds, no  murmurs, rubs or gallops Abdomen: no tenderness or distention, no masses by palpation, no abnormal pulsatility or arterial bruits, normal bowel sounds, no hepatosplenomegaly Extremities: no clubbing, cyanosis or edema; 2+ radial, ulnar and brachial pulses bilaterally; 2+ right femoral, posterior tibial and dorsalis pedis pulses; 2+ left femoral, posterior tibial and dorsalis pedis pulses; no subclavian or femoral bruits Neurological: grossly nonfocal Psych: Normal mood and affect    Wt Readings from Last 3 Encounters:  08/05/18 193 lb 12.8 oz (87.9 kg)  02/22/18 193 lb 12.8 oz (87.9 kg)  08/02/17 196  lb 3.2 oz (89 kg)      Studies/Labs Reviewed:   EKG:  EKG is ordered today.  The ekg ordered today demonstrates normal sinus rhythm, normal tracing Recent Labs: No results found for requested labs within last 8760 hours.   Lipid Panel    Component Value Date/Time   CHOL 193 08/02/2017 0910   TRIG 88 08/02/2017 0910   HDL 40 08/02/2017 0910   CHOLHDL 4.8 08/02/2017 0910   CHOLHDL 4.6 07/27/2016 1019   VLDL 17 07/27/2016 1019   LDLCALC 135 (H) 08/02/2017 0910   May 25, 2018 Total cholesterol 166, HDL 35, LDL 109, triglycerides 110 Creatinine 1.28, hemoglobin 15.7, hemoglobin A1c 5.2% ASSESSMENT:    1. Dyslipidemia   2. Essential hypertension   3. NSVT (nonsustained ventricular tachycardia) (HCC)   4. Acquired hypothyroidism   5. Drug-induced erectile dysfunction      PLAN:  In order of problems listed above:  1. HLP: Without taking any medications he has shown a steady improvement in LDL cholesterol over the last few years.  Approaching target range of less than 100. 2. HTN: Uncontrolled, offered to lower his medications due to the occasional orthostatic hypotension but he is very pleased with his current clinical status and the blood pressure control and does not want to change. 3. NSVT: This has been infrequently detected in the past on monitoring devices.  He does  not have structural heart disease and he takes a beta-blocker.  He has not had palpitations in the last 12 months. 4. HypoT4: TSH was 2.59 in February 2019. 5. ED: Good response to PDE 5 inhibitors.  Reminded him never to take nitrates with his medications.    Medication Adjustments/Labs and Tests Ordered: Current medicines are reviewed at length with the patient today.  Concerns regarding medicines are outlined above.  Medication changes, Labs and Tests ordered today are listed in the Patient Instructions below. Patient Instructions  Medication Instructions:  The current medical regimen is effective;  continue present plan and medications.  If you need a refill on your cardiac medications before your next appointment, please call your pharmacy.    Follow-Up: [facility], you and your health needs are our priority.  As part of our continuing mission to provide you with exceptional heart care, we have created designated Provider Care Teams.  These Care Teams include your primary Cardiologist (physician) and Advanced Practice Providers (APPs -  Physician Assistants and Nurse Practitioners) who all work together to provide you with the care you need, when you need it. You will need a follow up appointment in 12 months.  Please call our office 2 months in advance to schedule this appointment.  You may see Thurmon FairMihai Arin Vanosdol, MD or one of the following Advanced Practice Providers on your designated Care Team: [place]ao Meng, [place]PA-C . Micah FlesherAngela Duke, PA-C         Signed, Thurmon FairMihai Damyn Weitzel, MD  08/05/2018 8:36 AM    [facility] [address], [place], [place]  [address] Phone: [phone]) [phone]; Fax: [phone]) [phone]

## 2018-09-12 DIAGNOSIS — L570 Actinic keratosis: Secondary | ICD-10-CM | POA: Diagnosis not present

## 2018-09-12 DIAGNOSIS — X32XXXA Exposure to sunlight, initial encounter: Secondary | ICD-10-CM | POA: Diagnosis not present

## 2018-09-26 ENCOUNTER — Other Ambulatory Visit: Payer: Self-pay | Admitting: Cardiovascular Disease

## 2018-11-30 DIAGNOSIS — H16223 Keratoconjunctivitis sicca, not specified as Sjogren's, bilateral: Secondary | ICD-10-CM | POA: Diagnosis not present

## 2018-11-30 DIAGNOSIS — H16221 Keratoconjunctivitis sicca, not specified as Sjogren's, right eye: Secondary | ICD-10-CM | POA: Diagnosis not present

## 2018-11-30 DIAGNOSIS — H16222 Keratoconjunctivitis sicca, not specified as Sjogren's, left eye: Secondary | ICD-10-CM | POA: Diagnosis not present

## 2019-02-20 DIAGNOSIS — E890 Postprocedural hypothyroidism: Secondary | ICD-10-CM | POA: Diagnosis not present

## 2019-03-03 DIAGNOSIS — M545 Low back pain: Secondary | ICD-10-CM | POA: Diagnosis not present

## 2019-03-29 DIAGNOSIS — Z23 Encounter for immunization: Secondary | ICD-10-CM | POA: Diagnosis not present

## 2019-04-17 DIAGNOSIS — E890 Postprocedural hypothyroidism: Secondary | ICD-10-CM | POA: Diagnosis not present

## 2019-04-24 DIAGNOSIS — N5201 Erectile dysfunction due to arterial insufficiency: Secondary | ICD-10-CM | POA: Diagnosis not present

## 2019-04-24 DIAGNOSIS — R972 Elevated prostate specific antigen [PSA]: Secondary | ICD-10-CM | POA: Diagnosis not present

## 2019-05-08 DIAGNOSIS — H16223 Keratoconjunctivitis sicca, not specified as Sjogren's, bilateral: Secondary | ICD-10-CM | POA: Diagnosis not present

## 2019-05-08 DIAGNOSIS — H348312 Tributary (branch) retinal vein occlusion, right eye, stable: Secondary | ICD-10-CM | POA: Diagnosis not present

## 2019-05-08 DIAGNOSIS — H109 Unspecified conjunctivitis: Secondary | ICD-10-CM | POA: Diagnosis not present

## 2019-05-08 DIAGNOSIS — Z961 Presence of intraocular lens: Secondary | ICD-10-CM | POA: Diagnosis not present

## 2019-06-05 DIAGNOSIS — Z0000 Encounter for general adult medical examination without abnormal findings: Secondary | ICD-10-CM | POA: Diagnosis not present

## 2019-06-05 DIAGNOSIS — E663 Overweight: Secondary | ICD-10-CM | POA: Diagnosis not present

## 2019-06-05 DIAGNOSIS — Z6826 Body mass index (BMI) 26.0-26.9, adult: Secondary | ICD-10-CM | POA: Diagnosis not present

## 2019-06-05 DIAGNOSIS — Z1389 Encounter for screening for other disorder: Secondary | ICD-10-CM | POA: Diagnosis not present

## 2019-06-05 DIAGNOSIS — Z0001 Encounter for general adult medical examination with abnormal findings: Secondary | ICD-10-CM | POA: Diagnosis not present

## 2019-06-05 DIAGNOSIS — E559 Vitamin D deficiency, unspecified: Secondary | ICD-10-CM | POA: Diagnosis not present

## 2019-08-25 ENCOUNTER — Encounter: Payer: Self-pay | Admitting: Cardiovascular Disease

## 2019-08-25 ENCOUNTER — Other Ambulatory Visit: Payer: Self-pay

## 2019-08-25 ENCOUNTER — Ambulatory Visit: Payer: PPO | Admitting: Cardiovascular Disease

## 2019-08-25 VITALS — BP 110/80 | HR 64 | Ht 70.0 in | Wt 196.0 lb

## 2019-08-25 DIAGNOSIS — I10 Essential (primary) hypertension: Secondary | ICD-10-CM | POA: Diagnosis not present

## 2019-08-25 DIAGNOSIS — I4729 Other ventricular tachycardia: Secondary | ICD-10-CM

## 2019-08-25 DIAGNOSIS — I472 Ventricular tachycardia: Secondary | ICD-10-CM | POA: Diagnosis not present

## 2019-08-25 DIAGNOSIS — N1831 Chronic kidney disease, stage 3a: Secondary | ICD-10-CM | POA: Diagnosis not present

## 2019-08-25 DIAGNOSIS — E039 Hypothyroidism, unspecified: Secondary | ICD-10-CM | POA: Diagnosis not present

## 2019-08-25 DIAGNOSIS — E785 Hyperlipidemia, unspecified: Secondary | ICD-10-CM | POA: Diagnosis not present

## 2019-08-25 NOTE — Patient Instructions (Signed)

## 2019-08-25 NOTE — Progress Notes (Signed)
Patient ID: Derrick [name], male   DOB: 10/07/1950, 69 y.o.   MRN: [id number]    Cardiology Office Note    Date:  08/25/2019   ID:  [name], [name] September 12, 1950, MRN [id number]  PCP:  Shawnie Dapper, PA-C  Cardiologist:   Thurmon Fair, MD   Chief Complaint  Patient presents with  . Hypertension  . Hyperlipidemia    History of Present Illness:  Derrick [name] is a 69 y.o. male with systemic hypertension, hypercholesterolemia, erectile dysfunction, who presents for routine follow-up.   Covey has not had any cardiovascular problems during the last year.  He continues to walk on a daily basis.  Yesterday he walked more than 12,000 steps.  He does not have issues with shortness of breath or chest pain during activity.  He pulled his bicipital tendon and has had some soreness in his right shoulder, especially in the evenings, improved temporarily after a steroid shot.  He is trying to exercise the shoulder for improvement.  He no longer takes tadalafil since this began to cause head fullness and dizziness.  He denies intermittent claudication, leg edema, focal neurological complaints.  His most recent creatinine was elevated at 1.36 and he has been referred to nephrology, but reviewing his labs over the last several years his creatinine has often been in the 1.32 range as far back as 2016.  He does not have diabetes but takes 3 medications for his blood pressure, including a thiazide and ACE inhibitor.  He has mild dyslipidemia with low HDL and mildly elevated LDL cholesterol but prefers not to take a statin.  He has been avoiding ice cream and exercising daily.  In the last 3-year his HDL has increased from the 20s to 40 and his LDL has been staying less than 130.  In 2013, he had an echo that showed normal LV size and systolic function and normal valves. A nuclear stress test in 2005 was a normal test, but he did have brief nonsustained VT during the ECG portion of the test.  In 2013 his Holter  monitor showed a single 6 beat run of nonsustained VT.   Past Medical History:  Diagnosis Date  . Arthritis NECK  . Cervical radiculopathy   . Chest pain, atypical 2005   myoview 09/13/03-normal perfusion  . ED (erectile dysfunction)    intolerant to viagra and cialis  . Hematuria   . History of prostatitis   . Hyperlipidemia   . Hypertension CARDIOLOGIST- [facility]   STRESS TEST YRS AGO-- OK  ; LAST VISIT 1 MONTH AGO  . Hypothyroidism   . Palpitations    event monitor 03-2012-showed a single 6 beat run of NSVT; echo 05/25/12-EF55-60%, mild LVH  . S/P cervical spinal fusion   . TIA (transient ischemic attack)   . Vertigo 2005   nl carotid dopplers 08/09/2003    Past Surgical History:  Procedure Laterality Date  . ANTERIOR CERVICAL DECOMP/DISCECTOMY FUSION  07-29-2000   C4 - C7  . COLONOSCOPY N/A 01/07/2015   Procedure: COLONOSCOPY;  Surgeon: Malissa Hippo, MD;  [facility];  Service: Endoscopy;  Laterality: N/A;  145  . CYSTOSCOPY W/ RETROGRADES  12/25/2011   Procedure: CYSTOSCOPY WITH RETROGRADE PYELOGRAM;  Surgeon: Marcine Matar, MD;  Location[facility];  Service: Urology;  Laterality: Bilateral;  . CYSTOSCOPY W/ URETEROSCOPY  20 YRS AGO  (APPROX. 1990'S)    Outpatient Medications Prior to Visit  Medication Sig Dispense Refill  .  aspirin 81 MG EC tablet TAKE 2 TABLETS BY MOUTH ONCE DAILY 180 tablet 3  . bisoprolol-hydrochlorothiazide (ZIAC) 5-6.25 MG tablet Take 1 tablet by mouth every evening. 90 tablet 3  . diazepam (VALIUM) 5 MG tablet take 1 tablet by mouth every 8 hours if needed for muscle spasm  0  . levothyroxine (SYNTHROID, LEVOTHROID) 50 MCG tablet Take 1 tablet (50 mcg total) by mouth daily before breakfast. 30 tablet 0  . lisinopril (PRINIVIL,ZESTRIL) 5 MG tablet Take 1 tablet (5 mg total) by mouth daily. 90 tablet 3  . CIALIS 5 MG tablet Take 1 tablet (5 mg total) by mouth as needed. 30 tablet 0   No  facility-administered medications prior to visit.     Allergies:   Patient has no known allergies.   Social History   Socioeconomic History  . Marital status: Married    Spouse name: Not on file  . Number of children: 2  . Years of education: Not on file  . Highest education level: Not on file  Occupational History  . Occupation: retired  Tobacco Use  . Smoking status: Never Smoker  . Smokeless tobacco: Never Used  Substance and Sexual Activity  . Alcohol use: No    Alcohol/week: 0.0 standard drinks  . Drug use: No  . Sexual activity: Not on file  Other Topics Concern  . Not on file  Social History Narrative   Patient drinks very little caffeine.   Patient is right handed.   Social Determinants of Health   Financial Resource Strain:   . Difficulty of Paying Living Expenses: Not on file  Food Insecurity:   . Worried About Charity fundraiser in the Last Year: Not on file  . Ran Out of Food in the Last Year: Not on file  Transportation Needs:   . Lack of Transportation (Medical): Not on file  . Lack of Transportation (Non-Medical): Not on file  Physical Activity:   . Days of Exercise per Week: Not on file  . Minutes of Exercise per Session: Not on file  Stress:   . Feeling of Stress : Not on file  Social Connections:   . Frequency of Communication with Friends and Family: Not on file  . Frequency of Social Gatherings with Friends and Family: Not on file  . Attends Religious Services: Not on file  . Active Member of Clubs or Organizations: Not on file  . Attends Archivist Meetings: Not on file  . Marital Status: Not on file     Family History:  The patient's family history includes CVA in his father; Healthy in his sister, son, and son; Heart attack in his brother and mother.   ROS:   Please see the history of present illness.    ROS All other systems reviewed and are negative.   PHYSICAL EXAM:   VS:  BP 110/80   Pulse 64   Ht 5\' 10"  (1.778 m)    Wt 196 lb (88.9 kg)   SpO2 96%   BMI 28.12 kg/m      General: Alert, oriented x3, no distress, he appears very fit and younger than stated age Head: no evidence of trauma, PERRL, EOMI, no exophtalmos or lid lag, no myxedema, no xanthelasma; normal ears, nose and oropharynx Neck: normal jugular venous pulsations and no hepatojugular reflux; brisk carotid pulses without delay and no carotid bruits Chest: clear to auscultation, no signs of consolidation by percussion or palpation, normal fremitus, symmetrical and full respiratory  excursions Cardiovascular: normal position and quality of the apical impulse, regular rhythm, normal first and second heart sounds, no murmurs, rubs or gallops Abdomen: no tenderness or distention, no masses by palpation, no abnormal pulsatility or arterial bruits, normal bowel sounds, no hepatosplenomegaly Extremities: no clubbing, cyanosis or edema; 2+ radial, ulnar and brachial pulses bilaterally; 2+ right femoral, posterior tibial and dorsalis pedis pulses; 2+ left femoral, posterior tibial and dorsalis pedis pulses; no subclavian or femoral bruits Neurological: grossly nonfocal Psych: Normal mood and affect  Wt Readings from Last 3 Encounters:  08/25/19 196 lb (88.9 kg)  08/05/18 193 lb 12.8 oz (87.9 kg)  02/22/18 193 lb 12.8 oz (87.9 kg)      Studies/Labs Reviewed:   EKG:  EKG is ordered today.  It shows normal sinus rhythm and is a completely normal tracing Recent Labs: No results found for requested labs within last 8760 hours.   Lipid Panel    Component Value Date/Time   CHOL 193 08/02/2017 0910   TRIG 88 08/02/2017 0910   HDL 40 08/02/2017 0910   CHOLHDL 4.8 08/02/2017 0910   CHOLHDL 4.6 07/27/2016 1019   VLDL 17 07/27/2016 1019   LDLCALC 135 (H) 08/02/2017 0910   May 25, 2018 Total cholesterol 166, HDL 35, LDL 109, triglycerides 110 Creatinine 1.28, hemoglobin 15.7, hemoglobin A1c 5.2% June 05, 2019 Total cholesterol 188, HDL 40,  LDL 129, triglycerides of 409 Creatinine 1.36, BUN 27, glucose 114, hemoglobin 16.2, potassium 4.4 ASSESSMENT:    1. Dyslipidemia   2. Essential hypertension   3. NSVT (nonsustained ventricular tachycardia) (HCC)   4. Acquired hypothyroidism   5. Stage 3a chronic kidney disease      PLAN:  In order of problems listed above:  1. HLP: With lifestyle changes, he has managed to bring his LDL and HDL cholesterol levels into acceptable range.  Still prefers not to take medications. 2. HTN: Well-controlled.  Probably the major reason for his chronic kidney abnormalities. 3. NSVT: No symptomatic events.  Denies palpitations.  This has been infrequently detected in the past on monitoring devices.  He does not have structural heart disease and he takes a beta-blocker.  4. HypoT4: On levothyroxine supplement. 5. CKD: Mild and with a very slow worsening trend.  He has been referred to nephrology.  At this point I do not see a reason to discontinue either the thiazide diuretic or his ACE inhibitor.    Medication Adjustments/Labs and Tests Ordered: Current medicines are reviewed at length with the patient today.  Concerns regarding medicines are outlined above.  Medication changes, Labs and Tests ordered today are listed in the Patient Instructions below. Patient Instructions  Medication Instructions:  No changes *If you need a refill on your cardiac medications before your next appointment, please call your pharmacy*   Lab Work: None ordered If you have labs (blood work) drawn today and your tests are completely normal, you will receive your results only by: Marland Kitchen MyChart Message (if you have MyChart) OR . A paper copy in the mail If you have any lab test that is abnormal or we need to change your treatment, we will call you to review the results.   Testing/Procedures: None ordered   Follow-Up: At [facility], you and your health needs are our priority.  As part of our continuing  mission to provide you with exceptional heart care, we have created designated Provider Care Teams.  These Care Teams include your primary Cardiologist (physician) and Advanced Practice Providers (APPs -  Physician Assistants and Nurse Practitioners) who all work together to provide you with the care you need, when you need it.  We recommend signing up for the patient portal called "MyChart".  Sign up information is provided on this After Visit Summary.  MyChart is used to connect with patients for Virtual Visits (Telemedicine).  Patients are able to view lab/test results, encounter notes, upcoming appointments, etc.  Non-urgent messages can be sent to your provider as well.   To learn more about what you can do with MyChart, go to [email or web address].    Your next appointment:   12 month(s)  The format for your next appointment:   In Person  Provider:   You may see Thurmon Fair, MD or one of the following Advanced Practice Providers on your designated Care Team:    Azalee Course, PA-C  Micah Flesher, [place] or   Judy Pimple, PA-C        Signed, Thurmon Fair, MD  08/25/2019 10:00 AM    [facility] [address] [place], [place], [place]  [address] Phone: [phone]; Fax: [phone]

## 2019-08-30 ENCOUNTER — Ambulatory Visit: Payer: PPO | Admitting: Cardiovascular Disease

## 2019-09-15 ENCOUNTER — Other Ambulatory Visit: Payer: Self-pay

## 2019-09-15 MED ORDER — ASPIRIN 81 MG PO TBEC: 162.0000 mg | DELAYED_RELEASE_TABLET | Freq: Every day | ORAL | 3 refills | Status: DC

## 2019-11-01 DIAGNOSIS — M79672 Pain in left foot: Secondary | ICD-10-CM | POA: Diagnosis not present

## 2019-11-03 DIAGNOSIS — M79672 Pain in left foot: Secondary | ICD-10-CM | POA: Diagnosis not present

## 2019-11-03 DIAGNOSIS — N2581 Secondary hyperparathyroidism of renal origin: Secondary | ICD-10-CM | POA: Diagnosis not present

## 2019-11-03 DIAGNOSIS — N1831 Chronic kidney disease, stage 3a: Secondary | ICD-10-CM | POA: Diagnosis not present

## 2019-11-03 DIAGNOSIS — I129 Hypertensive chronic kidney disease with stage 1 through stage 4 chronic kidney disease, or unspecified chronic kidney disease: Secondary | ICD-10-CM | POA: Diagnosis not present

## 2019-11-03 DIAGNOSIS — D631 Anemia in chronic kidney disease: Secondary | ICD-10-CM | POA: Diagnosis not present

## 2019-11-03 DIAGNOSIS — N189 Chronic kidney disease, unspecified: Secondary | ICD-10-CM | POA: Diagnosis not present

## 2019-11-03 DIAGNOSIS — I472 Ventricular tachycardia: Secondary | ICD-10-CM | POA: Diagnosis not present

## 2019-11-03 DIAGNOSIS — E039 Hypothyroidism, unspecified: Secondary | ICD-10-CM | POA: Diagnosis not present

## 2019-11-06 ENCOUNTER — Other Ambulatory Visit: Payer: Self-pay | Admitting: Nephrology

## 2019-11-06 DIAGNOSIS — N1831 Chronic kidney disease, stage 3a: Secondary | ICD-10-CM

## 2019-11-06 DIAGNOSIS — I129 Hypertensive chronic kidney disease with stage 1 through stage 4 chronic kidney disease, or unspecified chronic kidney disease: Secondary | ICD-10-CM

## 2019-11-09 ENCOUNTER — Ambulatory Visit
Admission: RE | Admit: 2019-11-09 | Discharge: 2019-11-09 | Disposition: A | Discharge status: Home / Self Care | Payer: PPO | Source: Ambulatory Visit | Attending: Nephrology | Admitting: Nephrology

## 2019-11-09 DIAGNOSIS — N183 Chronic kidney disease, stage 3 unspecified: Secondary | ICD-10-CM | POA: Diagnosis not present

## 2019-11-09 DIAGNOSIS — N1831 Chronic kidney disease, stage 3a: Secondary | ICD-10-CM

## 2019-11-09 DIAGNOSIS — I129 Hypertensive chronic kidney disease with stage 1 through stage 4 chronic kidney disease, or unspecified chronic kidney disease: Secondary | ICD-10-CM

## 2019-11-13 DIAGNOSIS — M79672 Pain in left foot: Secondary | ICD-10-CM | POA: Diagnosis not present

## 2019-11-15 DIAGNOSIS — M25572 Pain in left ankle and joints of left foot: Secondary | ICD-10-CM | POA: Diagnosis not present

## 2019-11-29 DIAGNOSIS — G5762 Lesion of plantar nerve, left lower limb: Secondary | ICD-10-CM | POA: Diagnosis not present

## 2020-02-02 DIAGNOSIS — E039 Hypothyroidism, unspecified: Secondary | ICD-10-CM | POA: Diagnosis not present

## 2020-02-02 DIAGNOSIS — N1831 Chronic kidney disease, stage 3a: Secondary | ICD-10-CM | POA: Diagnosis not present

## 2020-02-02 DIAGNOSIS — I472 Ventricular tachycardia: Secondary | ICD-10-CM | POA: Diagnosis not present

## 2020-02-02 DIAGNOSIS — N2581 Secondary hyperparathyroidism of renal origin: Secondary | ICD-10-CM | POA: Diagnosis not present

## 2020-02-02 DIAGNOSIS — I129 Hypertensive chronic kidney disease with stage 1 through stage 4 chronic kidney disease, or unspecified chronic kidney disease: Secondary | ICD-10-CM | POA: Diagnosis not present

## 2020-02-12 DIAGNOSIS — E890 Postprocedural hypothyroidism: Secondary | ICD-10-CM | POA: Diagnosis not present

## 2020-02-19 DIAGNOSIS — I10 Essential (primary) hypertension: Secondary | ICD-10-CM | POA: Diagnosis not present

## 2020-02-19 DIAGNOSIS — E890 Postprocedural hypothyroidism: Secondary | ICD-10-CM | POA: Diagnosis not present

## 2020-04-09 ENCOUNTER — Telehealth: Payer: Self-pay | Admitting: Cardiovascular Disease

## 2020-04-09 NOTE — Telephone Encounter (Signed)
Appointment made 05/14/20 at 10 am. Patient notified to call  Back if anything further is needed.

## 2020-04-09 NOTE — Telephone Encounter (Signed)
Symptoms sound non-cardiac.  If there is an open slot on the schedule it's fine to add him, but please reassure him there is no reason for urgent appt.

## 2020-04-09 NOTE — Telephone Encounter (Signed)
Pt c/o of Chest Pain: STAT if CP now or developed within 24 hours  1. Are you having CP right now? No   2. Are you experiencing any other symptoms (ex. SOB, nausea, vomiting, sweating)? No   3. How long have you been experiencing CP? Since Sunday   4. Is your CP continuous or coming and going? Coming and going   5. Have you taken Nitroglycerin? No    Derrick [name] is calling requesting he be worked in for an appt with Dr. Royann Shivers. He states he has been having sharp pains in his chest since Sunday that are off and on, but goes away when he burps. Please advise.

## 2020-04-09 NOTE — Telephone Encounter (Signed)
I spoke with patient. He reports brief sharp pains in chest that started Saturday evening.  Happens at random times.  Not related to exertion. He has limited his long walks because he wanted to get this checked out.  Pain lasts 1-2 seconds.  Improves when he burps.  He has been taking tums and Catering manager which helps. No shortness of breath. I offered him an appointment with APP this week but he only wants to see Dr C and is asking to be worked in. I told him I would send note to Dr Erin Hearing nurse regarding appointment.  I advised him to contact PCP and schedule appointment

## 2020-04-09 NOTE — Telephone Encounter (Signed)
Patient notified.  I scheduled Derrick [name] to see Dr Royann Shivers on February 2,2022 at 9:00 and placed Derrick [name] on waitlist

## 2020-04-15 DIAGNOSIS — R972 Elevated prostate specific antigen [PSA]: Secondary | ICD-10-CM | POA: Diagnosis not present

## 2020-04-26 DIAGNOSIS — M542 Cervicalgia: Secondary | ICD-10-CM | POA: Diagnosis not present

## 2020-05-14 ENCOUNTER — Encounter: Payer: Self-pay | Admitting: Cardiovascular Disease

## 2020-05-14 ENCOUNTER — Other Ambulatory Visit: Payer: Self-pay

## 2020-05-14 ENCOUNTER — Ambulatory Visit (INDEPENDENT_AMBULATORY_CARE_PROVIDER_SITE_OTHER): Payer: PPO | Admitting: Cardiovascular Disease

## 2020-05-14 VITALS — BP 128/70 | HR 59 | Ht 70.0 in | Wt 202.0 lb

## 2020-05-14 DIAGNOSIS — I10 Essential (primary) hypertension: Secondary | ICD-10-CM

## 2020-05-14 DIAGNOSIS — I472 Ventricular tachycardia: Secondary | ICD-10-CM | POA: Diagnosis not present

## 2020-05-14 DIAGNOSIS — E785 Hyperlipidemia, unspecified: Secondary | ICD-10-CM

## 2020-05-14 DIAGNOSIS — R072 Precordial pain: Secondary | ICD-10-CM

## 2020-05-14 DIAGNOSIS — E039 Hypothyroidism, unspecified: Secondary | ICD-10-CM

## 2020-05-14 DIAGNOSIS — I4729 Other ventricular tachycardia: Secondary | ICD-10-CM

## 2020-05-14 NOTE — Progress Notes (Signed)
Patient ID: Derrick [name], male   DOB: 14-May-1951, 69 y.o.   MRN: [id number]    Cardiology Office Note    Date:  05/14/2020   ID:  Senon, Nixon July 08, 1950, MRN [id number]  PCP:  Shawnie Dapper, PA-C  Cardiologist:   Thurmon Fair, MD   Chief Complaint  Patient presents with  . Hyperlipidemia    History of Present Illness:  Derrick [name] is a 69 y.o. male with systemic hypertension, hypercholesterolemia, erectile dysfunction, who presents after an episode of prolonged chest discomfort.  Creating he had "gas pain" similar to complaints these had in the past.  He was worried because the symptoms persisted off and on for a couple of days.  However the discomfort was relieved by belching or taking Tums.  It eventually resolved.  During that period of time he was able to remain physically active and there was no relationship between the intensity of his "gas pain" and his level of physical activity.  He remains very active.  He walks 10 miles a day on hikes.  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  He denies exertional chest discomfort or dyspnea.  He does not have leg edema, claudication or any new focal neurological complaints.  Since the generic preparation of his lisinopril was changed to a new manufacturer he has noticed some problems with mild weakness within an hour or 2 after taking the medication.  I checked the pill against the image database that he has not been taking the correct lisinopril 5 mg dose, albeit from a new manufacturer.  He is due for his wellness [facility] at [facility] in December.  His most recent creatinine was 1.36 and his LDL cholesterol was 129 on labs from a year ago.  Also last December his HDL was 40, which is a substantial improvement compared with previous years when it was in the 43s.  In 2013, he had an echo that showed normal LV size and systolic function and normal valves. A nuclear  stress test in 2005 was a normal test, but he did have brief nonsustained VT during the ECG portion of the test.  In 2013 his Holter monitor showed a single 6 beat run of nonsustained VT.   Past Medical History:  Diagnosis Date  . Arthritis NECK  . Cervical radiculopathy   . Chest pain, atypical 2005   myoview 09/13/03-normal perfusion  . ED (erectile dysfunction)    intolerant to viagra and cialis  . Hematuria   . History of prostatitis   . Hyperlipidemia   . Hypertension CARDIOLOGIST- [facility]   STRESS TEST YRS AGO-- OK  ; LAST VISIT 1 MONTH AGO  . Hypothyroidism   . Palpitations    event monitor 03-2012-showed a single 6 beat run of NSVT; echo 05/25/12-EF55-60%, mild LVH  . S/P cervical spinal fusion   . TIA (transient ischemic attack)   . Vertigo 2005   nl carotid dopplers 08/09/2003    Past Surgical History:  Procedure Laterality Date  . ANTERIOR CERVICAL DECOMP/DISCECTOMY FUSION  07-29-2000   C4 - C7  . COLONOSCOPY N/A 01/07/2015   Procedure: COLONOSCOPY;  Surgeon: Malissa Hippo, MD;  [facility];  Service: Endoscopy;  Laterality: N/A;  145  . CYSTOSCOPY W/ RETROGRADES  12/25/2011   Procedure: CYSTOSCOPY WITH RETROGRADE PYELOGRAM;  Surgeon: Marcine Matar, MD;  Location: [facility];  Service: Urology;  Laterality: Bilateral;  .  CYSTOSCOPY W/ URETEROSCOPY  20 YRS AGO  (APPROX. 1990'S)    Outpatient Medications Prior to Visit  Medication Sig Dispense Refill  . Ascorbic Acid (VITAMIN C) 1000 MG tablet Take 1,000 mg by mouth daily. 2 Tablets Daily    . aspirin 81 MG EC tablet Take 2 tablets (162 mg total) by mouth daily. Swallow whole. 180 tablet 3  . bisoprolol-hydrochlorothiazide (ZIAC) 5-6.25 MG tablet Take 1 tablet by mouth every evening. 90 tablet 3  . calcium-vitamin D (OSCAL WITH D) 500-200 MG-UNIT tablet Take 1 tablet by mouth. 1 tablet daily    . diazepam (VALIUM) 5 MG tablet take 1 tablet by mouth every 8 hours if needed  for muscle spasm  0  . levothyroxine (SYNTHROID) 75 MCG tablet Take 75 mcg by mouth every morning.    Marland Kitchen lisinopril (PRINIVIL,ZESTRIL) 5 MG tablet Take 1 tablet (5 mg total) by mouth daily. 90 tablet 3  . Magnesium 100 MG CAPS Take 100 mg by mouth. 1 Daily    . Quercetin 250 MG TABS Take 250 mg by mouth. 1 Tablet Daily    . Zinc 30 MG TABS Take 30 mg by mouth. 1 Tablet Daily    . levothyroxine (SYNTHROID, LEVOTHROID) 50 MCG tablet Take 1 tablet (50 mcg total) by mouth daily before breakfast. 30 tablet 0   No facility-administered medications prior to visit.     Allergies:   Patient has no known allergies.   Social History   Socioeconomic History  . Marital status: Married    Spouse name: Not on file  . Number of children: 2  . Years of education: Not on file  . Highest education level: Not on file  Occupational History  . Occupation: retired  Tobacco Use  . Smoking status: Never Smoker  . Smokeless tobacco: Never Used  Vaping Use  . Vaping Use: Never used  Substance and Sexual Activity  . Alcohol use: No    Alcohol/week: 0.0 standard drinks  . Drug use: No  . Sexual activity: Not on file  Other Topics Concern  . Not on file  Social History Narrative   Patient drinks very little caffeine.   Patient is right handed.   Social Determinants of Health   Financial Resource Strain:   . Difficulty of Paying Living Expenses: Not on file  Food Insecurity:   . Worried About Programme researcher, broadcasting/film/video in the Last Year: Not on file  . Ran Out of Food in the Last Year: Not on file  Transportation Needs:   . Lack of Transportation (Medical): Not on file  . Lack of Transportation (Non-Medical): Not on file  Physical Activity:   . Days of Exercise per Week: Not on file  . Minutes of Exercise per Session: Not on file  Stress:   . Feeling of Stress : Not on file  Social Connections:   . Frequency of Communication with Friends and Family: Not on file  . Frequency of Social Gatherings with  Friends and Family: Not on file  . Attends Religious Services: Not on file  . Active Member of Clubs or Organizations: Not on file  . Attends Banker Meetings: Not on file  . Marital Status: Not on file     Family History:  The patient's family history includes CVA in his father; Healthy in his sister, son, and son; Heart attack in his brother and mother.   ROS:   Please see the history of present illness.  ROS All other systems are reviewed and are negative.   PHYSICAL EXAM:   VS:  BP 128/70   Pulse (!) 59   Ht 5\' 10"  (1.778 m)   Wt 202 lb (91.6 kg)   SpO2 98%   BMI 28.98 kg/m      General: Alert, oriented x3, no distress, appears very fit, younger than stated age Head: no evidence of trauma, PERRL, EOMI, no exophtalmos or lid lag, no myxedema, no xanthelasma; normal ears, nose and oropharynx Neck: normal jugular venous pulsations and no hepatojugular reflux; brisk carotid pulses without delay and no carotid bruits Chest: clear to auscultation, no signs of consolidation by percussion or palpation, normal fremitus, symmetrical and full respiratory excursions Cardiovascular: normal position and quality of the apical impulse, regular rhythm, normal first and second heart sounds, no murmurs, rubs or gallops Abdomen: no tenderness or distention, no masses by palpation, no abnormal pulsatility or arterial bruits, normal bowel sounds, no hepatosplenomegaly Extremities: no clubbing, cyanosis or edema; 2+ radial, ulnar and brachial pulses bilaterally; 2+ right femoral, posterior tibial and dorsalis pedis pulses; 2+ left femoral, posterior tibial and dorsalis pedis pulses; no subclavian or femoral bruits Neurological: grossly nonfocal Psych: Normal mood and affect   Wt Readings from Last 3 Encounters:  05/14/20 202 lb (91.6 kg)  08/25/19 196 lb (88.9 kg)  08/05/18 193 lb 12.8 oz (87.9 kg)      Studies/Labs Reviewed:   EKG:  EKG is ordered today.  It shows sinus  rhythm, normal tracing Recent Labs: No results found for requested labs within last 8760 hours.   Lipid Panel    Component Value Date/Time   CHOL 193 08/02/2017 0910   TRIG 88 08/02/2017 0910   HDL 40 08/02/2017 0910   CHOLHDL 4.8 08/02/2017 0910   CHOLHDL 4.6 07/27/2016 1019   VLDL 17 07/27/2016 1019   LDLCALC 135 (H) 08/02/2017 0910   May 25, 2018 Total cholesterol 166, HDL 35, LDL 109, triglycerides 110 Creatinine 1.28, hemoglobin 15.7, hemoglobin A1c 5.2% June 05, 2019 Total cholesterol 188, HDL 40, LDL 129, triglycerides of June 07, 2019 Creatinine 1.36, BUN 27, glucose 114, hemoglobin 16.2, potassium 4.4 ASSESSMENT:    1. Essential hypertension   2. Precordial pain   3. Dyslipidemia   4. NSVT (nonsustained ventricular tachycardia) (HCC)   5. Acquired hypothyroidism      PLAN:  In order of problems listed above:  1. Chest pain: His symptoms are highly compatible with a gastrointestinal cause of his chest discomfort, rather than angina pectoris.  With 2 days of continuous chest symptoms, would expect a change on his ECG today, but is completely normal.  He can perform heavy physical activity without complaints.  I do not think it is necessary to repeat his coronary  2. HLP: Chronically low HDL and slightly high LDL, which she has been improved with lifestyle changes.  He remains very physically active and is only minimally overweight.  Encouraged him to continue his healthy diet and regular physical activity. 3. HTN: Excellent control.  He has had some problems with weakness shortly after he takes his new preparation of lisinopril, conceivably because he has more rapid absorption of the active ingredients with the new generic preparation.  Asked him to split the lisinopril into 2 equal morning and evening doses.  If this does not resolve the complaints, will decrease lisinopril to 2.5 mg daily. 4. NSVT: This was infrequently detected on past monitoring devices, but has never  really caused palpitations or other symptoms.  He takes a beta-blocker.  He does not have significant structural heart disease.  Additional investigation is not necessary. 5. HypoT4: Normal TSH 4.81 in August 2021 6. CKD: Do not have any new work creatinine levels.  We will have labs in December with his primary care provider.    Medication Adjustments/Labs and Tests Ordered: Current medicines are reviewed at length with the patient today.  Concerns regarding medicines are outlined above.  Medication changes, Labs and Tests ordered today are listed in the Patient Instructions below. Patient Instructions  Medication Instructions:  No changes *If you need a refill on your cardiac medications before your next appointment, please call your pharmacy*   Lab Work: None ordered If you have labs (blood work) drawn today and your tests are completely normal, you will receive your results only by: Marland Kitchen. MyChart Message (if you have MyChart) OR . A paper copy in the mail If you have any lab test that is abnormal or we need to change your treatment, we will call you to review the results.   Testing/Procedures: None ordered   Follow-Up: At [facility], you and your health needs are our priority.  As part of our continuing mission to provide you with exceptional heart care, we have created designated Provider Care Teams.  These Care Teams include your primary Cardiologist (physician) and Advanced Practice Providers (APPs -  Physician Assistants and Nurse Practitioners) who all work together to provide you with the care you need, when you need it.  We recommend signing up for the patient portal called "MyChart".  Sign up information is provided on this After Visit Summary.  MyChart is used to connect with patients for Virtual Visits (Telemedicine).  Patients are able to view lab/test results, encounter notes, upcoming appointments, etc.  Non-urgent messages can be sent to your provider as well.   To  learn more about what you can do with MyChart, go to [email or web address].    Your next appointment:   12 month(s)  The format for your next appointment:   In Person  Provider:   Thurmon FairMihai Grae Cannata, MD       Signed, Thurmon FairMihai Flay Ghosh, MD  05/14/2020 11:06 AM    [facility] [address], [place], [place]  [address] Phone: [phone]; Fax: [phone]

## 2020-05-14 NOTE — Patient Instructions (Signed)

## 2020-05-20 DIAGNOSIS — E039 Hypothyroidism, unspecified: Secondary | ICD-10-CM | POA: Diagnosis not present

## 2020-05-20 DIAGNOSIS — N400 Enlarged prostate without lower urinary tract symptoms: Secondary | ICD-10-CM | POA: Diagnosis not present

## 2020-05-20 DIAGNOSIS — Z6826 Body mass index (BMI) 26.0-26.9, adult: Secondary | ICD-10-CM | POA: Diagnosis not present

## 2020-05-20 DIAGNOSIS — E663 Overweight: Secondary | ICD-10-CM | POA: Diagnosis not present

## 2020-05-20 DIAGNOSIS — Z0000 Encounter for general adult medical examination without abnormal findings: Secondary | ICD-10-CM | POA: Diagnosis not present

## 2020-05-27 DIAGNOSIS — H348312 Tributary (branch) retinal vein occlusion, right eye, stable: Secondary | ICD-10-CM | POA: Diagnosis not present

## 2020-05-27 DIAGNOSIS — Z961 Presence of intraocular lens: Secondary | ICD-10-CM | POA: Diagnosis not present

## 2020-05-27 DIAGNOSIS — H16223 Keratoconjunctivitis sicca, not specified as Sjogren's, bilateral: Secondary | ICD-10-CM | POA: Diagnosis not present

## 2020-05-27 DIAGNOSIS — H109 Unspecified conjunctivitis: Secondary | ICD-10-CM | POA: Diagnosis not present

## 2020-07-08 DIAGNOSIS — J22 Unspecified acute lower respiratory infection: Secondary | ICD-10-CM | POA: Diagnosis not present

## 2020-07-31 ENCOUNTER — Ambulatory Visit: Payer: PPO | Admitting: Cardiovascular Disease

## 2020-09-09 ENCOUNTER — Telehealth: Payer: Self-pay

## 2020-09-09 MED ORDER — ASPIRIN 81 MG PO TBEC
162.0000 mg | DELAYED_RELEASE_TABLET | Freq: Every day | ORAL | 3 refills | Status: DC
Start: 2020-09-09 — End: 2021-04-17

## 2020-09-09 NOTE — Telephone Encounter (Signed)
Not needed

## 2020-09-28 DIAGNOSIS — R972 Elevated prostate specific antigen [PSA]: Secondary | ICD-10-CM | POA: Diagnosis not present

## 2020-09-30 DIAGNOSIS — R972 Elevated prostate specific antigen [PSA]: Secondary | ICD-10-CM | POA: Diagnosis not present

## 2020-10-23 DIAGNOSIS — H903 Sensorineural hearing loss, bilateral: Secondary | ICD-10-CM | POA: Diagnosis not present

## 2020-10-25 DIAGNOSIS — M542 Cervicalgia: Secondary | ICD-10-CM | POA: Diagnosis not present

## 2020-11-11 DIAGNOSIS — H903 Sensorineural hearing loss, bilateral: Secondary | ICD-10-CM | POA: Diagnosis not present

## 2021-02-12 DIAGNOSIS — E890 Postprocedural hypothyroidism: Secondary | ICD-10-CM | POA: Diagnosis not present

## 2021-02-14 DIAGNOSIS — X32XXXD Exposure to sunlight, subsequent encounter: Secondary | ICD-10-CM | POA: Diagnosis not present

## 2021-02-14 DIAGNOSIS — L570 Actinic keratosis: Secondary | ICD-10-CM | POA: Diagnosis not present

## 2021-02-17 DIAGNOSIS — N189 Chronic kidney disease, unspecified: Secondary | ICD-10-CM | POA: Diagnosis not present

## 2021-02-17 DIAGNOSIS — E039 Hypothyroidism, unspecified: Secondary | ICD-10-CM | POA: Diagnosis not present

## 2021-02-17 DIAGNOSIS — N2581 Secondary hyperparathyroidism of renal origin: Secondary | ICD-10-CM | POA: Diagnosis not present

## 2021-02-17 DIAGNOSIS — I129 Hypertensive chronic kidney disease with stage 1 through stage 4 chronic kidney disease, or unspecified chronic kidney disease: Secondary | ICD-10-CM | POA: Diagnosis not present

## 2021-02-17 DIAGNOSIS — I472 Ventricular tachycardia: Secondary | ICD-10-CM | POA: Diagnosis not present

## 2021-02-17 DIAGNOSIS — N1831 Chronic kidney disease, stage 3a: Secondary | ICD-10-CM | POA: Diagnosis not present

## 2021-03-10 DIAGNOSIS — I10 Essential (primary) hypertension: Secondary | ICD-10-CM | POA: Diagnosis not present

## 2021-03-10 DIAGNOSIS — E890 Postprocedural hypothyroidism: Secondary | ICD-10-CM | POA: Diagnosis not present

## 2021-04-14 DIAGNOSIS — R972 Elevated prostate specific antigen [PSA]: Secondary | ICD-10-CM | POA: Diagnosis not present

## 2021-04-17 ENCOUNTER — Other Ambulatory Visit: Payer: Self-pay | Admitting: Cardiovascular Disease

## 2021-04-21 DIAGNOSIS — N5201 Erectile dysfunction due to arterial insufficiency: Secondary | ICD-10-CM | POA: Diagnosis not present

## 2021-04-21 DIAGNOSIS — R972 Elevated prostate specific antigen [PSA]: Secondary | ICD-10-CM | POA: Diagnosis not present

## 2021-04-22 ENCOUNTER — Other Ambulatory Visit: Payer: Self-pay | Admitting: Urology

## 2021-04-22 DIAGNOSIS — R972 Elevated prostate specific antigen [PSA]: Secondary | ICD-10-CM

## 2021-04-25 DIAGNOSIS — I10 Essential (primary) hypertension: Secondary | ICD-10-CM | POA: Diagnosis not present

## 2021-04-25 DIAGNOSIS — Z6827 Body mass index (BMI) 27.0-27.9, adult: Secondary | ICD-10-CM | POA: Diagnosis not present

## 2021-04-25 DIAGNOSIS — M542 Cervicalgia: Secondary | ICD-10-CM | POA: Diagnosis not present

## 2021-05-16 ENCOUNTER — Ambulatory Visit
Admission: RE | Admit: 2021-05-16 | Discharge: 2021-05-16 | Disposition: A | Discharge status: Home / Self Care | Payer: PPO | Source: Ambulatory Visit | Attending: Urology | Admitting: Urology

## 2021-05-16 ENCOUNTER — Other Ambulatory Visit: Payer: Self-pay

## 2021-05-16 DIAGNOSIS — R972 Elevated prostate specific antigen [PSA]: Secondary | ICD-10-CM | POA: Diagnosis not present

## 2021-05-16 MED ORDER — GADOBENATE DIMEGLUMINE 529 MG/ML IV SOLN
18.0000 mL | Freq: Once | INTRAVENOUS | Status: AC | PRN
  Administered 2021-05-16: 18 mL via INTRAVENOUS

## 2021-05-19 DIAGNOSIS — H43393 Other vitreous opacities, bilateral: Secondary | ICD-10-CM | POA: Diagnosis not present

## 2021-05-19 DIAGNOSIS — H16223 Keratoconjunctivitis sicca, not specified as Sjogren's, bilateral: Secondary | ICD-10-CM | POA: Diagnosis not present

## 2021-05-19 DIAGNOSIS — H348312 Tributary (branch) retinal vein occlusion, right eye, stable: Secondary | ICD-10-CM | POA: Diagnosis not present

## 2021-05-19 DIAGNOSIS — H109 Unspecified conjunctivitis: Secondary | ICD-10-CM | POA: Diagnosis not present

## 2021-05-26 DIAGNOSIS — E039 Hypothyroidism, unspecified: Secondary | ICD-10-CM | POA: Diagnosis not present

## 2021-05-26 DIAGNOSIS — Z1331 Encounter for screening for depression: Secondary | ICD-10-CM | POA: Diagnosis not present

## 2021-05-26 DIAGNOSIS — R319 Hematuria, unspecified: Secondary | ICD-10-CM | POA: Diagnosis not present

## 2021-05-26 DIAGNOSIS — Z0000 Encounter for general adult medical examination without abnormal findings: Secondary | ICD-10-CM | POA: Diagnosis not present

## 2021-05-26 DIAGNOSIS — Z6826 Body mass index (BMI) 26.0-26.9, adult: Secondary | ICD-10-CM | POA: Diagnosis not present

## 2021-05-26 DIAGNOSIS — E663 Overweight: Secondary | ICD-10-CM | POA: Diagnosis not present

## 2021-05-26 DIAGNOSIS — I10 Essential (primary) hypertension: Secondary | ICD-10-CM | POA: Diagnosis not present

## 2021-05-26 DIAGNOSIS — Z13220 Encounter for screening for lipoid disorders: Secondary | ICD-10-CM | POA: Diagnosis not present

## 2021-07-11 ENCOUNTER — Ambulatory Visit: Payer: PPO | Admitting: Cardiovascular Disease

## 2021-07-11 ENCOUNTER — Encounter: Payer: Self-pay | Admitting: Cardiovascular Disease

## 2021-07-11 ENCOUNTER — Other Ambulatory Visit: Payer: Self-pay

## 2021-07-11 VITALS — BP 140/78 | HR 60 | Ht 70.0 in | Wt 193.0 lb

## 2021-07-11 DIAGNOSIS — I4729 Other ventricular tachycardia: Secondary | ICD-10-CM | POA: Diagnosis not present

## 2021-07-11 DIAGNOSIS — E039 Hypothyroidism, unspecified: Secondary | ICD-10-CM

## 2021-07-11 DIAGNOSIS — E785 Hyperlipidemia, unspecified: Secondary | ICD-10-CM

## 2021-07-11 DIAGNOSIS — N1831 Chronic kidney disease, stage 3a: Secondary | ICD-10-CM | POA: Diagnosis not present

## 2021-07-11 DIAGNOSIS — R748 Abnormal levels of other serum enzymes: Secondary | ICD-10-CM

## 2021-07-11 DIAGNOSIS — I10 Essential (primary) hypertension: Secondary | ICD-10-CM

## 2021-07-11 NOTE — Progress Notes (Signed)
Patient ID: Derrick [name], male   DOB: 05/22/1951, 71 y.o.   MRN: [id number]    Cardiology Office Note    Date:  07/11/2021   ID:  Derrick [name] [birth year], MRN [id number]  PCP:  Cory Munch, PA-C  Cardiologist:   Sanda Klein, MD   Chief Complaint  Patient presents with   Follow-up    12 months.    History of Present Illness:  Derrick [name] is a 71 y.o. male with systemic hypertension, hypercholesterolemia, erectile dysfunction, who presents after an episode of prolonged chest discomfort.  He has had some problems with pain in his right heel where he has a previous Achilles tendon rupture so he has shortened his walking, but he still puts in a good 2 hours of walking (about 8 miles) every week, at a minimum.  The patient specifically denies any chest pain at rest exertion, dyspnea at rest or with exertion, orthopnea, paroxysmal nocturnal dyspnea, syncope, palpitations, focal neurological deficits, intermittent claudication, lower extremity edema, unexplained weight gain, cough, hemoptysis or wheezing.   He has not had any more problems with weakness when taking his blood pressure medication.  He is scheduled to undergo biopsy of his prostate next Wednesday, but due to abnormality seen on a prostate MRI  Most recent labs performed 06 Nov 2020 continue to show borderline fasting glucose of 108 and a mildly elevated creatinine at 1.3, but surprisingly he also had abnormalities of the transaminases (AST 89 previously 23, ALT 128 previously 21).  He does not drink alcohol.  He has received vaccinations for hepatitis in the past.  Other labs were normal.  His LDL remains moderately elevated 139 but he has not had evidence of either coronary or peripheral arterial disease.   In 2013, he had an echo that showed normal LV size and systolic function and normal valves. A nuclear stress test in 2005 was a normal test, but he did have brief nonsustained VT during the ECG portion of the test.   In 2013 his Holter monitor showed a single 6 beat run of nonsustained VT.   Past Medical History:  Diagnosis Date   Arthritis NECK   Cervical radiculopathy    Chest pain, atypical 2005   myoview 09/13/03-normal perfusion   ED (erectile dysfunction)    intolerant to viagra and cialis   Hematuria    History of prostatitis    Hyperlipidemia    Hypertension CARDIOLOGIST- [facility]   STRESS TEST YRS AGO-- OK  ; LAST VISIT 1 MONTH AGO   Hypothyroidism    Palpitations    event monitor 03-2012-showed a single 6 beat run of NSVT; echo 05/25/12-EF55-60%, mild LVH   S/P cervical spinal fusion    TIA (transient ischemic attack)    Vertigo 2005   nl carotid dopplers 08/09/2003    Past Surgical History:  Procedure Laterality Date   ANTERIOR CERVICAL DECOMP/DISCECTOMY FUSION  07-29-2000   C4 - C7   COLONOSCOPY N/A 01/07/2015   Procedure: COLONOSCOPY;  Surgeon: Rogene Houston, MD;  [facility];  Service: Endoscopy;  Laterality: N/A;  [place]  12/25/2011   Procedure: CYSTOSCOPY WITH RETROGRADE PYELOGRAM;  Surgeon: Franchot Gallo, MD;  Location: [facility];  Service: Urology;  Laterality: Bilateral;   CYSTOSCOPY W/ URETEROSCOPY  20 YRS AGO  (APPROX. 1990'S)    Outpatient Medications Prior to Visit  Medication Sig Dispense Refill   Ascorbic Acid (VITAMIN C) 1000 MG tablet  Take 1,000 mg by mouth daily. 2 Tablets Daily     ASPIRIN LOW DOSE 81 MG EC tablet TAKE 2 TABLETS(162 MG) BY MOUTH DAILY. SWALLOW WHOLE 180 tablet 3   bisoprolol-hydrochlorothiazide (ZIAC) 5-6.25 MG tablet Take 1 tablet by mouth every evening. 90 tablet 3   calcium-vitamin D (OSCAL WITH D) 500-200 MG-UNIT tablet Take 1 tablet by mouth. 1 tablet daily     diazepam (VALIUM) 5 MG tablet take 1 tablet by mouth every 8 hours if needed for muscle spasm  0   levothyroxine (SYNTHROID) 75 MCG tablet Take 75 mcg by mouth every morning.     lisinopril (PRINIVIL,ZESTRIL) 5  MG tablet Take 1 tablet (5 mg total) by mouth daily. 90 tablet 3   Magnesium 100 MG CAPS Take 100 mg by mouth every other day. 1 Daily     Quercetin 250 MG TABS Take 250 mg by mouth every other day. 1 Tablet Daily     Zinc 30 MG TABS Take 30 mg by mouth every other day. 1 Tablet Daily     No facility-administered medications prior to visit.     Allergies:   Patient has no known allergies.   Social History   Socioeconomic History   Marital status: Married    Spouse name: Not on file   Number of children: 2   Years of education: Not on file   Highest education level: Not on file  Occupational History   Occupation: retired  Tobacco Use   Smoking status: Never   Smokeless tobacco: Never  Vaping Use   Vaping Use: Never used  Substance and Sexual Activity   Alcohol use: No    Alcohol/week: 0.0 standard drinks   Drug use: No   Sexual activity: Not on file  Other Topics Concern   Not on file  Social History Narrative   Patient drinks very little caffeine.   Patient is right handed.   Social Determinants of Health   Financial Resource Strain: Not on file  Food Insecurity: Not on file  Transportation Needs: Not on file  Physical Activity: Not on file  Stress: Not on file  Social Connections: Not on file     Family History:  The patient's family history includes CVA in his father; Healthy in his sister, son, and son; Heart attack in his brother and mother.   ROS:   Please see the history of present illness.    ROS All other systems are reviewed and are negative.   PHYSICAL EXAM:   VS:  BP 140/78 (BP Location: Left Arm, Patient Position: Sitting, Cuff Size: Normal)    Pulse 60    Ht 5\' 10"  (1.778 m)    Wt 193 lb (87.5 kg)    BMI 27.69 kg/m      General: Alert, oriented x3, no distress, appears fit and younger than stated age. Head: no evidence of trauma, PERRL, EOMI, no exophtalmos or lid lag, no myxedema, no xanthelasma; normal ears, nose and oropharynx Neck: normal  jugular venous pulsations and no hepatojugular reflux; brisk carotid pulses without delay and no carotid bruits Chest: clear to auscultation, no signs of consolidation by percussion or palpation, normal fremitus, symmetrical and full respiratory excursions Cardiovascular: normal position and quality of the apical impulse, regular rhythm, normal first and second heart sounds, no murmurs, rubs or gallops Abdomen: no tenderness or distention, no masses by palpation, no abnormal pulsatility or arterial bruits, normal bowel sounds, no hepatosplenomegaly Extremities: no clubbing, cyanosis or edema; 2+  radial, ulnar and brachial pulses bilaterally; 2+ right femoral, posterior tibial and dorsalis pedis pulses; 2+ left femoral, posterior tibial and dorsalis pedis pulses; no subclavian or femoral bruits Neurological: grossly nonfocal Psych: Normal mood and affect    Wt Readings from Last 3 Encounters:  07/11/21 193 lb (87.5 kg)  05/14/20 202 lb (91.6 kg)  08/25/19 196 lb (88.9 kg)      Studies/Labs Reviewed:   EKG:  EKG is ordered today.  It shows normal sinus rhythm and although there is a little bit of delayed R wave progression to lead V3 is otherwise a normal tracing.  No repolarization changes.  QTc 396 ms. Recent Labs: No results found for requested labs within last 8760 hours.  05/26/2021 Hemoglobin 16.6, creatinine 1.3, glucose 108, AST 89, ALT 120, TSH 2.61 Cholesterol 201, HDL 42, LDL 139, triglycerides 113 Lipid Panel    Component Value Date/Time   CHOL 193 08/02/2017 0910   TRIG 88 08/02/2017 0910   HDL 40 08/02/2017 0910   CHOLHDL 4.8 08/02/2017 0910   CHOLHDL 4.6 07/27/2016 1019   VLDL 17 07/27/2016 1019   LDLCALC 135 (H) 08/02/2017 0910    ASSESSMENT:    1. Dyslipidemia   2. Essential hypertension   3. NSVT (nonsustained ventricular tachycardia)   4. Acquired hypothyroidism   5. Stage 3a chronic kidney disease ([place])   6. Abnormal transaminases      PLAN:  In  order of problems listed above:   HLP: Prefers to stay off medication and now that his transaminases are abnormal it is not the time to consider starting a statin.  Encouraged him to continue walking, maintain a healthy weight, eat a healthy diet. HTN: Well-controlled, usually lower than today. NSVT: Asymptomatic.  VT was infrequently detected on past monitoring devices, but has never really caused palpitations or other symptoms.  He takes a beta-blocker.  He does not have significant structural heart disease.   HypoT4: TSH in normal range CKD: Stable creatinine around 1.3.  Avoid NSAIDs.  He has seen a nephrologist and no specific treatment was recommended. Abnormal transaminases: Etiology uncertain.  Bilirubin and alkaline phosphatase were normal.  He does not drink alcohol.  None of his medications are particularly prone to cause liver toxicity.  Repeat labs today.    Medication Adjustments/Labs and Tests Ordered: Current medicines are reviewed at length with the patient today.  Concerns regarding medicines are outlined above.  Medication changes, Labs and Tests ordered today are listed in the Patient Instructions below. Patient Instructions  Medication Instructions:  No changes *If you need a refill on your cardiac medications before your next appointment, please call your pharmacy*   Lab Work: Your provider would like for you to have the following labs today: CMEt  If you have labs (blood work) drawn today and your tests are completely normal, you will receive your results only by: [place] (if you have MyChart) OR A paper copy in the mail If you have any lab test that is abnormal or we need to change your treatment, we will call you to review the results.   Testing/Procedures: None ordered   Follow-Up: At [facility], you and your health needs are our priority.  As part of our continuing mission to provide you with exceptional heart care, we have created designated  Provider Care Teams.  These Care Teams include your primary Cardiologist (physician) and Advanced Practice Providers (APPs -  Physician Assistants and Nurse Practitioners) who all work together to provide  you with the care you need, when you need it.  We recommend signing up for the patient portal called "MyChart".  Sign up information is provided on this After Visit Summary.  MyChart is used to connect with patients for Virtual Visits (Telemedicine).  Patients are able to view lab/test results, encounter notes, upcoming appointments, etc.  Non-urgent messages can be sent to your provider as well.   To learn more about what you can do with MyChart, go to [email or web address].    Your next appointment:   12 month(s)  The format for your next appointment:   In Person  Provider:   Sanda Klein, MD {      Signed, Sanda Klein, MD  07/11/2021 11:08 AM    [address], [place], [place]  [address] Phone: [phone]; Fax: [phone]

## 2021-07-11 NOTE — Patient Instructions (Signed)
Medication Instructions:  No changes *If you need a refill on your cardiac medications before your next appointment, please call your pharmacy*   Lab Work: Your provider would like for you to have the following labs today: CMEt  If you have labs (blood work) drawn today and your tests are completely normal, you will receive your results only by: MyChart Message (if you have MyChart) OR A paper copy in the mail If you have any lab test that is abnormal or we need to change your treatment, we will call you to review the results.   Testing/Procedures: None ordered   Follow-Up: At [facility], you and your health needs are our priority.  As part of our continuing mission to provide you with exceptional heart care, we have created designated Provider Care Teams.  These Care Teams include your primary Cardiologist (physician) and Advanced Practice Providers (APPs -  Physician Assistants and Nurse Practitioners) who all work together to provide you with the care you need, when you need it.  We recommend signing up for the patient portal called "MyChart".  Sign up information is provided on this After Visit Summary.  MyChart is used to connect with patients for Virtual Visits (Telemedicine).  Patients are able to view lab/test results, encounter notes, upcoming appointments, etc.  Non-urgent messages can be sent to your provider as well.   To learn more about what you can do with MyChart, go to [email or web address].    Your next appointment:   12 month(s)  The format for your next appointment:   In Person  Provider:   Thurmon Fair, MD {

## 2021-07-12 LAB — COMPREHENSIVE METABOLIC PANEL
ALT: 17 IU/L (ref 0–44)
AST: 19 IU/L (ref 0–40)
Albumin/Globulin Ratio: 1.8 (ref 1.2–2.2)
Albumin: 4.4 g/dL (ref 3.7–4.7)
Alkaline Phosphatase: 63 IU/L (ref 44–121)
BUN/Creatinine Ratio: 16 (ref 10–24)
BUN: 20 mg/dL (ref 8–27)
Bilirubin Total: 0.5 mg/dL (ref 0.0–1.2)
CO2: 27 mmol/L (ref 20–29)
Calcium: 9 mg/dL (ref 8.6–10.2)
Chloride: 101 mmol/L (ref 96–106)
Creatinine, Ser: 1.24 mg/dL (ref 0.76–1.27)
Globulin, Total: 2.5 g/dL (ref 1.5–4.5)
Glucose: 94 mg/dL (ref 70–99)
Potassium: 4.6 mmol/L (ref 3.5–5.2)
Sodium: 139 mmol/L (ref 134–144)
Total Protein: 6.9 g/dL (ref 6.0–8.5)
eGFR: 62 mL/min/{1.73_m2} (ref >59)

## 2021-07-16 DIAGNOSIS — R972 Elevated prostate specific antigen [PSA]: Secondary | ICD-10-CM | POA: Diagnosis not present

## 2021-07-23 DIAGNOSIS — H9313 Tinnitus, bilateral: Secondary | ICD-10-CM | POA: Diagnosis not present

## 2021-07-23 DIAGNOSIS — H903 Sensorineural hearing loss, bilateral: Secondary | ICD-10-CM | POA: Diagnosis not present

## 2021-08-25 DIAGNOSIS — H903 Sensorineural hearing loss, bilateral: Secondary | ICD-10-CM | POA: Diagnosis not present

## 2021-11-19 ENCOUNTER — Telehealth: Payer: Self-pay | Admitting: Cardiovascular Disease

## 2021-11-19 NOTE — Telephone Encounter (Signed)
Left a message for the patient to call back.  

## 2021-11-19 NOTE — Telephone Encounter (Signed)
STAT if HR is under 50 or over 120 (normal HR is 60-100 beats per minute)  What is your heart rate? 55 this morning, oxygen 97 PR 62 now  Do you have a log of your heart rate readings (document readings)? 53 at night, 3-4 drops to 53 his apple watch alerts him  Do you have any other symptoms?no   Patient states his apple watch has been alerting him that around 3-4 am while sleeping, his HR drops low.

## 2021-11-19 NOTE — Telephone Encounter (Signed)
Returned the call to the patient. He stated that his son got him an apple watch and he noticed that his heart rate during the night is in the 50's. He was calling to make sure that this is okay. He has been advised that during the night, our heart rates will go lower. During the day his heart rate stays in the 60's which is his norm.

## 2021-11-19 NOTE — Telephone Encounter (Signed)
Patient was returning call. Please advise ?

## 2021-12-17 ENCOUNTER — Encounter (INDEPENDENT_AMBULATORY_CARE_PROVIDER_SITE_OTHER): Payer: Self-pay | Admitting: *Deleted

## 2022-01-09 DIAGNOSIS — N5201 Erectile dysfunction due to arterial insufficiency: Secondary | ICD-10-CM | POA: Diagnosis not present

## 2022-01-09 DIAGNOSIS — R972 Elevated prostate specific antigen [PSA]: Secondary | ICD-10-CM | POA: Diagnosis not present

## 2022-01-09 DIAGNOSIS — N400 Enlarged prostate without lower urinary tract symptoms: Secondary | ICD-10-CM | POA: Diagnosis not present

## 2022-01-14 ENCOUNTER — Telehealth (INDEPENDENT_AMBULATORY_CARE_PROVIDER_SITE_OTHER): Payer: Self-pay

## 2022-01-14 ENCOUNTER — Encounter (INDEPENDENT_AMBULATORY_CARE_PROVIDER_SITE_OTHER): Payer: Self-pay

## 2022-01-14 ENCOUNTER — Other Ambulatory Visit (INDEPENDENT_AMBULATORY_CARE_PROVIDER_SITE_OTHER): Payer: Self-pay

## 2022-01-14 DIAGNOSIS — Z86010 Personal history of colonic polyps: Secondary | ICD-10-CM

## 2022-01-14 NOTE — Telephone Encounter (Signed)
Kimbley Sprague Ann Dhwani Venkatesh, CMA  ?

## 2022-01-14 NOTE — Telephone Encounter (Signed)
Referring MD/PCP: Phillips Odor   Procedure: Tcs  Reason/Indication:  RECALL HX OF POLYPS  Has patient had this procedure before?  YES 12/19/14  If so, when, by whom and where?    Is there a family history of colon cancer?  NO   Who?  What age when diagnosed?    Is patient diabetic? If yes, Type 1 or Type 2   NO      Does patient have prosthetic heart valve or mechanical valve?  NO  Do you have a pacemaker/defibrillator?  NO  Has patient ever had endocarditis/atrial fibrillation? NO  Does patient use oxygen? NO  Has patient had joint replacement within last 12 months?  NO   Is patient constipated or do they take laxatives? NO  Does patient have a history of alcohol/drug use?  NO  Have you had a stroke/heart attack last 6 mths? NO  Do you take medicine for weight loss?  NO  For male patients,: have you had a hysterectomy N/A                      are you post menopausal N/A                      do you still have your menstrual cycle N/A  Is patient on blood thinner such as Coumadin, Plavix and/or Aspirin? N/A  Medications: lisinopril 5 mg daily, bisoprolol/hctz 5mg /6.25 mg bid daily, levothyroxine 0.075 mg daily  Allergies: NKDA  Medication Adjustment per Dr none   Procedure date & time: 01/19/22 at 9:25

## 2022-01-16 ENCOUNTER — Encounter (HOSPITAL_COMMUNITY)
Admission: RE | Admit: 2022-01-16 | Discharge: 2022-01-16 | Disposition: A | Discharge status: Home / Self Care | Payer: PPO | Source: Ambulatory Visit | Attending: Gastroenterology | Admitting: Gastroenterology

## 2022-01-16 DIAGNOSIS — Z86010 Personal history of colonic polyps: Secondary | ICD-10-CM | POA: Diagnosis not present

## 2022-01-16 DIAGNOSIS — Z01812 Encounter for preprocedural laboratory examination: Secondary | ICD-10-CM | POA: Insufficient documentation

## 2022-01-16 LAB — BASIC METABOLIC PANEL
Anion gap: 6 (ref 5–15)
BUN: 19 mg/dL (ref 8–23)
CO2: 29 mmol/L (ref 22–32)
Calcium: 9.3 mg/dL (ref 8.9–10.3)
Chloride: 106 mmol/L (ref 98–111)
Creatinine, Ser: 1.27 mg/dL — ABNORMAL HIGH (ref 0.61–1.24)
GFR, Estimated: >60 mL/min (ref >60)
Glucose, Bld: 110 mg/dL — ABNORMAL HIGH (ref 70–99)
Potassium: 4.6 mmol/L (ref 3.5–5.1)
Sodium: 141 mmol/L (ref 135–145)

## 2022-01-16 NOTE — Patient Instructions (Signed)
   Your procedure is scheduled on: 01/19/2022  Report to [facility] Main Entrance at  7:45   AM.  Call this number if you have problems the morning of surgery: [phone]   Remember:              Follow Directions on the letter you received from Your Physician's office regarding the Bowel Prep              No Smoking the day of Procedure :   Take these medicines the morning of surgery with A SIP OF WATER: levothyroxine   Do not wear jewelry, make-up or nail polish.    Do not bring valuables to the hospital.  Contacts, dentures or bridgework may not be worn into surgery.  .   Patients discharged the day of surgery will not be allowed to drive home.     Colonoscopy, Adult, Care After This sheet gives you information about how to care for yourself after your procedure. Your health care provider may also give you more specific instructions. If you have problems or questions, contact your health care provider. What can I expect after the procedure? After the procedure, it is common to have: A small amount of blood in your stool for 24 hours after the procedure. Some gas. Mild abdominal cramping or bloating.  Follow these instructions at home: General instructions  For the first 24 hours after the procedure: Do not drive or use machinery. Do not sign important documents. Do not drink alcohol. Do your regular daily activities at a slower pace than normal. Eat soft, easy-to-digest foods. Rest often. Take over-the-counter or prescription medicines only as told by your health care provider. It is up to you to get the results of your procedure. Ask your health care provider, or the department performing the procedure, when your results will be ready. Relieving cramping and bloating Try walking around when you have cramps or feel bloated. Apply heat to your abdomen as told by your health care provider. Use a heat source that your health care provider recommends, such as a moist heat  pack or a heating pad. Place a towel between your skin and the heat source. Leave the heat on for 20-30 minutes. Remove the heat if your skin turns bright red. This is especially important if you are unable to feel pain, heat, or cold. You may have a greater risk of getting burned. Eating and drinking Drink enough fluid to keep your urine clear or pale yellow. Resume your normal diet as instructed by your health care provider. Avoid heavy or fried foods that are hard to digest. Avoid drinking alcohol for as long as instructed by your health care provider. Contact a health care provider if: You have blood in your stool 2-3 days after the procedure. Get help right away if: You have more than a small spotting of blood in your stool. You pass large blood clots in your stool. Your abdomen is swollen. You have nausea or vomiting. You have a fever. You have increasing abdominal pain that is not relieved with medicine. This information is not intended to replace advice given to you by your health care provider. Make sure you discuss any questions you have with your health care provider. Document Released: 01/28/2004 Document Revised: 03/09/2016 Document Reviewed: 08/27/2015 Elsevier Interactive Patient Education  [employer].

## 2022-01-19 ENCOUNTER — Other Ambulatory Visit: Payer: Self-pay

## 2022-01-19 ENCOUNTER — Ambulatory Visit (HOSPITAL_BASED_OUTPATIENT_CLINIC_OR_DEPARTMENT_OTHER): Payer: PPO | Admitting: Anesthesiology

## 2022-01-19 ENCOUNTER — Encounter (HOSPITAL_COMMUNITY)
Admission: RE | Disposition: A | Discharge status: Home / Self Care | Payer: Self-pay | Source: Ambulatory Visit | Attending: Gastroenterology

## 2022-01-19 ENCOUNTER — Encounter (INDEPENDENT_AMBULATORY_CARE_PROVIDER_SITE_OTHER): Payer: Self-pay | Admitting: *Deleted

## 2022-01-19 ENCOUNTER — Encounter (HOSPITAL_COMMUNITY): Payer: Self-pay | Admitting: Gastroenterology

## 2022-01-19 ENCOUNTER — Ambulatory Visit (HOSPITAL_COMMUNITY): Payer: PPO | Admitting: Anesthesiology

## 2022-01-19 ENCOUNTER — Ambulatory Visit (HOSPITAL_COMMUNITY)
Admission: RE | Admit: 2022-01-19 | Discharge: 2022-01-19 | Disposition: A | Discharge status: Home / Self Care | Payer: PPO | Source: Ambulatory Visit | Attending: Gastroenterology | Admitting: Gastroenterology

## 2022-01-19 DIAGNOSIS — K648 Other hemorrhoids: Secondary | ICD-10-CM | POA: Insufficient documentation

## 2022-01-19 DIAGNOSIS — Z8673 Personal history of transient ischemic attack (TIA), and cerebral infarction without residual deficits: Secondary | ICD-10-CM | POA: Insufficient documentation

## 2022-01-19 DIAGNOSIS — E785 Hyperlipidemia, unspecified: Secondary | ICD-10-CM | POA: Diagnosis not present

## 2022-01-19 DIAGNOSIS — E039 Hypothyroidism, unspecified: Secondary | ICD-10-CM | POA: Insufficient documentation

## 2022-01-19 DIAGNOSIS — K649 Unspecified hemorrhoids: Secondary | ICD-10-CM

## 2022-01-19 DIAGNOSIS — Z1211 Encounter for screening for malignant neoplasm of colon: Secondary | ICD-10-CM | POA: Insufficient documentation

## 2022-01-19 DIAGNOSIS — K644 Residual hemorrhoidal skin tags: Secondary | ICD-10-CM | POA: Insufficient documentation

## 2022-01-19 DIAGNOSIS — Z86010 Personal history of colonic polyps: Secondary | ICD-10-CM

## 2022-01-19 DIAGNOSIS — I10 Essential (primary) hypertension: Secondary | ICD-10-CM | POA: Diagnosis not present

## 2022-01-19 HISTORY — PX: COLONOSCOPY WITH PROPOFOL: SHX5780

## 2022-01-19 LAB — HM COLONOSCOPY

## 2022-01-19 SURGERY — COLONOSCOPY WITH PROPOFOL
Anesthesia: General

## 2022-01-19 MED ORDER — PROPOFOL 500 MG/50ML IV EMUL
INTRAVENOUS | Status: DC | PRN
  Administered 2022-01-19: 100 ug/kg/min via INTRAVENOUS

## 2022-01-19 MED ORDER — LACTATED RINGERS IV SOLN
INTRAVENOUS | Status: DC
Start: 2022-01-19 — End: 2022-01-19

## 2022-01-19 MED ORDER — PROPOFOL 10 MG/ML IV BOLUS
INTRAVENOUS | Status: DC | PRN
  Administered 2022-01-19: 50 mg via INTRAVENOUS

## 2022-01-19 NOTE — Anesthesia Preprocedure Evaluation (Signed)
Anesthesia Evaluation  Patient identified by MRN, date of birth, ID band Patient awake    Reviewed: Allergy & Precautions, H&P , NPO status , Patient's Chart, lab work & pertinent test results, reviewed documented beta blocker date and time   Airway Mallampati: II  TM Distance: >3 FB Neck ROM: full    Dental no notable dental hx.    Pulmonary neg pulmonary ROS,    Pulmonary exam normal breath sounds clear to auscultation       Cardiovascular Exercise Tolerance: Good hypertension, negative cardio ROS   Rhythm:regular Rate:Normal     Neuro/Psych TIA Neuromuscular disease negative psych ROS   GI/Hepatic negative GI ROS, Neg liver ROS,   Endo/Other  Hypothyroidism   Renal/GU negative Renal ROS  negative genitourinary   Musculoskeletal   Abdominal   Peds  Hematology negative hematology ROS (+)   Anesthesia Other Findings   Reproductive/Obstetrics negative OB ROS                             Anesthesia Physical Anesthesia Plan  ASA: 2  Anesthesia Plan: General   Post-op Pain Management:    Induction:   PONV Risk Score and Plan: Propofol infusion  Airway Management Planned:   Additional Equipment:   Intra-op Plan:   Post-operative Plan:   Informed Consent: I have reviewed the patients History and Physical, chart, labs and discussed the procedure including the risks, benefits and alternatives for the proposed anesthesia with the patient or authorized representative who has indicated his/her understanding and acceptance.     Dental Advisory Given  Plan Discussed with: CRNA  Anesthesia Plan Comments:         Anesthesia Quick Evaluation

## 2022-01-19 NOTE — Discharge Instructions (Signed)
You are being discharged to home.  Resume your previous diet.  Your physician has recommended a repeat colonoscopy in 10 years for screening purposes.  

## 2022-01-19 NOTE — Op Note (Signed)
[facility] Patient Name: Derrick [name] Procedure Date: 01/19/2022 9:13 AM MRN: [id number] Date of Birth: Jun 28, 1951 Attending MD: Katrinka Blazing ,  CSN: [id number] Age: 71 Admit Type: Outpatient Procedure:                Colonoscopy Indications:              Screening for colorectal malignant neoplasm Providers:                Katrinka Blazing, Jannett Celestine, RN, Hinton Rao, Clyde Lundborg Thomasena Edis RN, RN Referring MD:              Medicines:                Monitored Anesthesia Care Complications:            No immediate complications. Estimated Blood Loss:     Estimated blood loss: none. Procedure:                Pre-Anesthesia Assessment:                           - Prior to the procedure, a History and Physical                            was performed, and patient medications, allergies                            and sensitivities were reviewed. The patient's                            tolerance of previous anesthesia was reviewed.                           - The risks and benefits of the procedure and the                            sedation options and risks were discussed with the                            patient. All questions were answered and informed                            consent was obtained.                           After obtaining informed consent, the colonoscope                            was passed under direct vision. Throughout the                            procedure, the patient's blood pressure, pulse, and                            oxygen saturations  were monitored continuously. The                            [id number]-HQ190L ([id number]) scope was introduced through                            the anus and advanced to the the cecum, identified                            by appendiceal orifice and ileocecal valve. The                            colonoscopy was performed without difficulty. The                             patient tolerated the procedure well. The quality                            of the bowel preparation was excellent. Scope In: 9:30:56 AM Scope Out: 9:56:53 AM Scope Withdrawal Time: 0 hours 17 minutes 42 seconds  Total Procedure Duration: 0 hours 25 minutes 57 seconds  Findings:      Hemorrhoids were found on perianal exam.      The colon (entire examined portion) appeared normal.      Non-bleeding external and internal hemorrhoids were found during       retroflexion. The hemorrhoids were small. Impression:               - Hemorrhoids found on perianal exam.                           - The entire examined colon is normal.                           - Non-bleeding external and internal hemorrhoids.                           - No specimens collected. Moderate Sedation:      Per Anesthesia Care Recommendation:           - Discharge patient to home (ambulatory).                           - Resume previous diet.                           - Repeat colonoscopy in 10 years for screening                            purposes. Procedure Code(s):        --- Professional ---                           T0160, Colorectal cancer screening; colonoscopy on                            individual not meeting criteria for high risk Diagnosis Code(s):        ---  Professional ---                           Z12.11, Encounter for screening for malignant                            neoplasm of colon                           K64.8, Other hemorrhoids CPT copyright 2019 American Medical Association. All rights reserved. The codes documented in this report are preliminary and upon coder review may  be revised to meet current compliance requirements. Katrinka Blazing, MD Katrinka Blazing,  01/19/2022 10:04:57 AM This report has been signed electronically. Number of Addenda: 0

## 2022-01-19 NOTE — H&P (Signed)
Derrick [name] is an 71 y.o. male.   Chief Complaint: colorectal cancer screening  HPI: 71 y/o M with PMH HTN, HLD, TIA, hypothyroidism, coming for screening colonoscopy. Las scpe in 2016, was normal. The patient denies having any complaints such as melena, hematochezia, abdominal pain or distention, change in her bowel movement consistency or frequency, no changes in her weight recently.  No family history of colorectal cancer.   Past Medical History:  Diagnosis Date   Arthritis NECK   Cervical radiculopathy    Chest pain, atypical 2005   myoview 09/13/03-normal perfusion   ED (erectile dysfunction)    intolerant to viagra and cialis   Hematuria    History of prostatitis    Hyperlipidemia    Hypertension CARDIOLOGIST- [facility]   STRESS TEST YRS AGO-- OK  ; LAST VISIT 1 MONTH AGO   Hypothyroidism    Palpitations    event monitor 03-2012-showed a single 6 beat run of NSVT; echo 05/25/12-EF55-60%, mild LVH   S/P cervical spinal fusion    TIA (transient ischemic attack)    Vertigo 2005   nl carotid dopplers 08/09/2003    Past Surgical History:  Procedure Laterality Date   ANTERIOR CERVICAL DECOMP/DISCECTOMY FUSION  07-29-2000   C4 - C7   COLONOSCOPY N/A 01/07/2015   Procedure: COLONOSCOPY;  Surgeon: Malissa Hippo, MD;  Location: [facility];  Service: Endoscopy;  Laterality: N/A;  145   CYSTOSCOPY W/ RETROGRADES  12/25/2011   Procedure: CYSTOSCOPY WITH RETROGRADE PYELOGRAM;  Surgeon: Marcine Matar, MD;  Location: [facility];  Service: Urology;  Laterality: Bilateral;   CYSTOSCOPY W/ URETEROSCOPY  20 YRS AGO  (APPROX. 1990'S)    Family History  Problem Relation Age of Onset   Heart attack Mother    CVA Father    Healthy Sister    Healthy Son    Healthy Son    Heart attack Brother    Social History:  reports that he has never smoked. He has never used smokeless tobacco. He reports that he does not drink alcohol and does not use  drugs.  Allergies: No Known Allergies  Medications Prior to Admission  Medication Sig Dispense Refill   ASPIRIN LOW DOSE 81 MG EC tablet TAKE 2 TABLETS(162 MG) BY MOUTH DAILY. SWALLOW WHOLE (Patient taking differently: Take 162 mg by mouth at bedtime.) 180 tablet 3   bisoprolol-hydrochlorothiazide (ZIAC) 5-6.25 MG tablet Take 1 tablet by mouth every evening. 90 tablet 3   diazepam (VALIUM) 5 MG tablet Take 2.5 mg by mouth at bedtime.  0   levothyroxine (SYNTHROID) 75 MCG tablet Take 75 mcg by mouth every morning.     lisinopril (PRINIVIL,ZESTRIL) 5 MG tablet Take 1 tablet (5 mg total) by mouth daily. 90 tablet 3    No results found for this or any previous visit (from the past 48 hour(s)). No results found.  Review of Systems  All other systems reviewed and are negative.   Blood pressure (!) 164/68, pulse 60, temperature 97.8 F (36.6 C), temperature source Oral, resp. rate 12, SpO2 100 %. Physical Exam  GENERAL: The patient is AO x3, in no acute distress. HEENT: Head is normocephalic and atraumatic. EOMI are intact. Mouth is well hydrated and without lesions. NECK: Supple. No masses LUNGS: Clear to auscultation. No presence of rhonchi/wheezing/rales. Adequate chest expansion HEART: RRR, normal s1 and s2. ABDOMEN: Soft, nontender, no guarding, no peritoneal signs, and nondistended. BS +. No masses. EXTREMITIES: Without any cyanosis, clubbing, rash,  lesions or edema. NEUROLOGIC: AOx3, no focal motor deficit. SKIN: no jaundice, no rashes  Assessment/Plan 71 y/o M with PMH HTN, HLD, TIA, hypothyroidism, coming for screening colonoscopy. Las scpe in 2016, was normal. The patient is at average risk for colorectal cancer.  We will proceed with colonoscopy today.   Dolores Frame, MD 01/19/2022, 9:08 AM

## 2022-01-19 NOTE — Transfer of Care (Signed)
Immediate Anesthesia Transfer of Care Note  Patient: Derrick [name]  Procedure(s) Performed: COLONOSCOPY WITH PROPOFOL  Patient Location: Short Stay  Anesthesia Type:MAC  Level of Consciousness: awake, alert  and oriented  Airway & Oxygen Therapy: Patient Spontanous Breathing  Post-op Assessment: Report given to RN and Post -op Vital signs reviewed and stable  Post vital signs: Reviewed and stable  Last Vitals:  Vitals Value Taken Time  BP 87/54 01/19/22 1001  Temp 36.3 C 01/19/22 1001  Pulse 61 01/19/22 1001  Resp 14 01/19/22 1001  SpO2 97 % 01/19/22 1001    Last Pain:  Vitals:   01/19/22 1001  TempSrc: Oral  PainSc:       Patients Stated Pain Goal: 9 (05/01/14 9458)  Complications: No notable events documented.

## 2022-01-20 NOTE — Anesthesia Postprocedure Evaluation (Signed)
Anesthesia Post Note  Patient: Derrick [name]  Procedure(s) Performed: COLONOSCOPY WITH PROPOFOL  Patient location during evaluation: Phase II Anesthesia Type: General Level of consciousness: awake Pain management: pain level controlled Vital Signs Assessment: post-procedure vital signs reviewed and stable Respiratory status: spontaneous breathing and respiratory function stable Cardiovascular status: blood pressure returned to baseline and stable Postop Assessment: no headache and no apparent nausea or vomiting Anesthetic complications: no Comments: Late entry   No notable events documented.   Last Vitals:  Vitals:   01/19/22 1001 01/19/22 1004  BP: (!) 87/54 124/78  Pulse: 61   Resp: 14 18  Temp: (!) 36.3 C   SpO2: 97% 100%    Last Pain:  Vitals:   01/19/22 1003  TempSrc:   PainSc: 0-No pain                 Windell Norfolk

## 2022-01-22 ENCOUNTER — Telehealth (INDEPENDENT_AMBULATORY_CARE_PROVIDER_SITE_OTHER): Payer: Self-pay | Admitting: *Deleted

## 2022-01-22 DIAGNOSIS — N182 Chronic kidney disease, stage 2 (mild): Secondary | ICD-10-CM | POA: Diagnosis not present

## 2022-01-22 DIAGNOSIS — I129 Hypertensive chronic kidney disease with stage 1 through stage 4 chronic kidney disease, or unspecified chronic kidney disease: Secondary | ICD-10-CM | POA: Diagnosis not present

## 2022-01-22 DIAGNOSIS — N2581 Secondary hyperparathyroidism of renal origin: Secondary | ICD-10-CM | POA: Diagnosis not present

## 2022-01-22 DIAGNOSIS — E039 Hypothyroidism, unspecified: Secondary | ICD-10-CM | POA: Diagnosis not present

## 2022-01-22 NOTE — Telephone Encounter (Signed)
Pt had colonscopy on Monday 7/24 and concerned he has not had a BM since then. Has been eating light meals since then. No abdominal pain. Feels fine. Has a little gas every once in awhile.  [phone]

## 2022-01-22 NOTE — Telephone Encounter (Signed)
He can eat full meals, no need to eat small meals. If still constipated after 3 days, can takes some Miralax daily

## 2022-01-23 NOTE — Telephone Encounter (Signed)
Discussed with patient per Dr. Levon Hedger - He can eat full meals, no need to eat small meals. If still constipated after 3 days, can takes some Miralax daily. Patient verbalized understanding.

## 2022-01-27 ENCOUNTER — Encounter (HOSPITAL_COMMUNITY): Payer: Self-pay | Admitting: Gastroenterology

## 2022-03-16 DIAGNOSIS — E890 Postprocedural hypothyroidism: Secondary | ICD-10-CM | POA: Diagnosis not present

## 2022-03-16 DIAGNOSIS — I10 Essential (primary) hypertension: Secondary | ICD-10-CM | POA: Diagnosis not present

## 2022-04-02 IMAGING — US US RENAL
1 series · 14 of 25 positions shown · non-contrast
Comparison: None.

CLINICAL DATA: Initial evaluation for chronic kidney disease, stage
III.

EXAM:
RENAL / URINARY TRACT ULTRASOUND COMPLETE

[Series 1: us renal · 0.23mm/px · 14 of 41 slices shown]
[im 1/41]
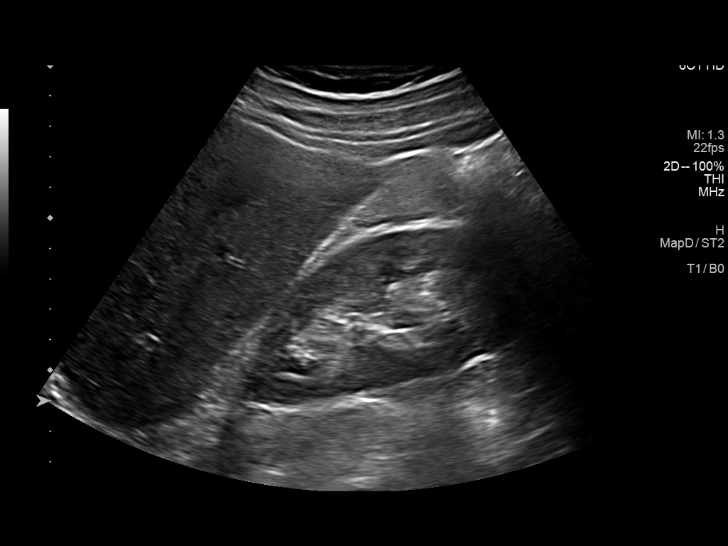
[im 4/41]
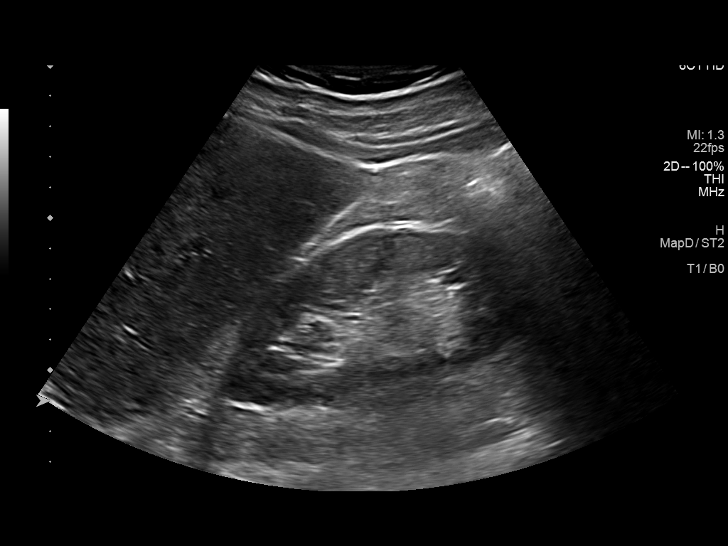
[im 7/41]
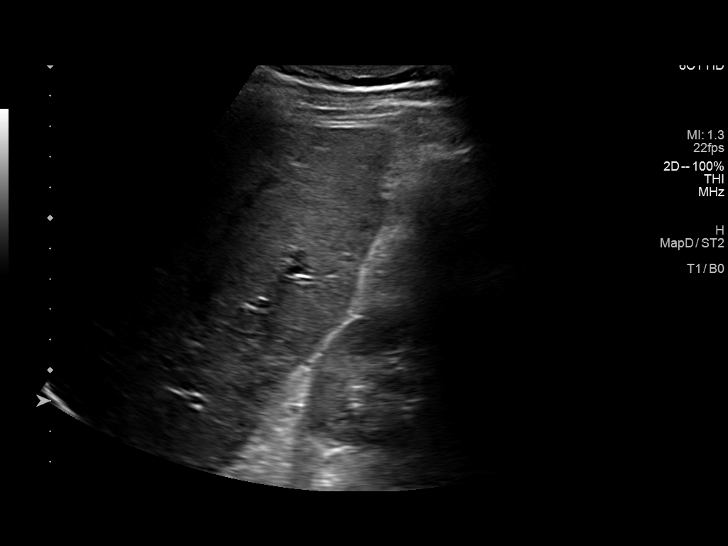
[im 11/41]
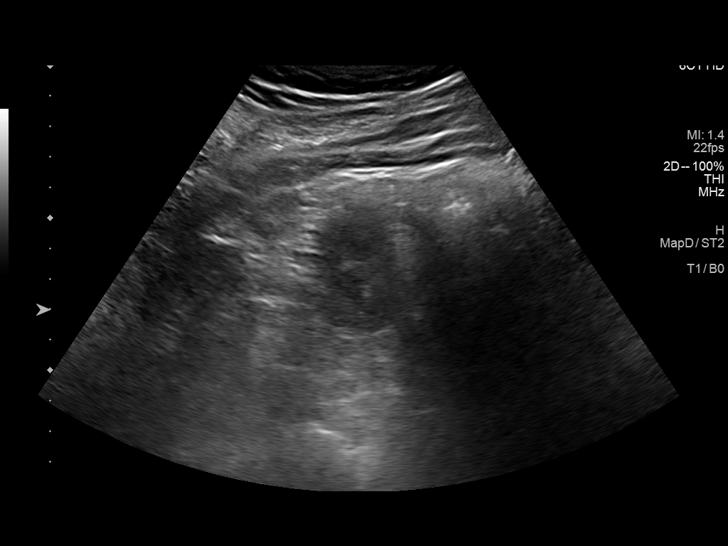
[im 14/41]
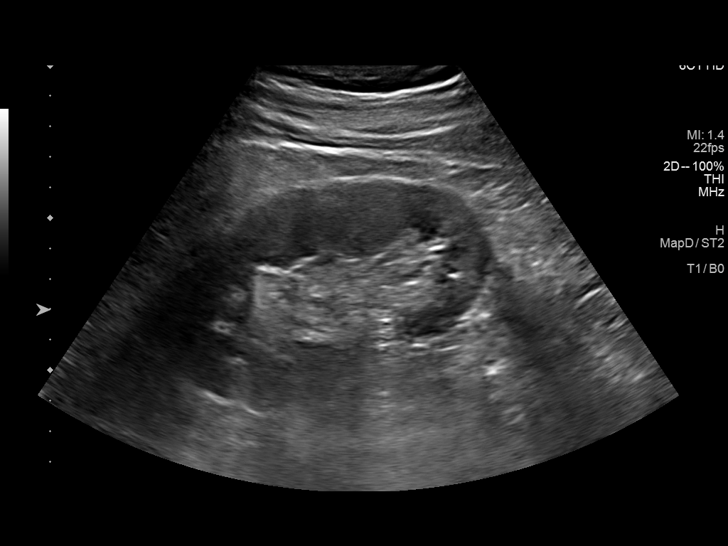
[im 16/41]
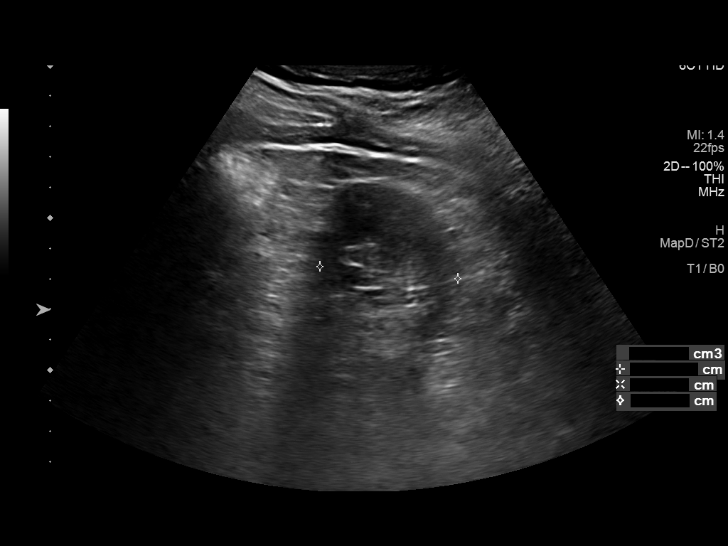
[im 19/41]
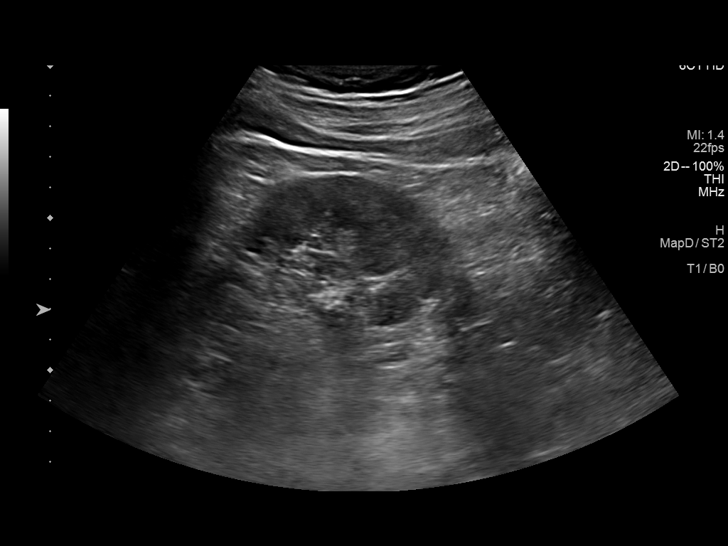
[im 22/41]
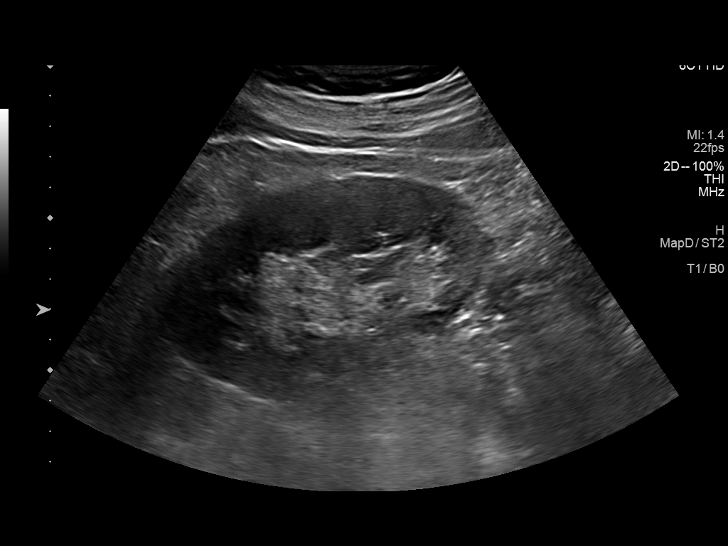
[im 26/41]
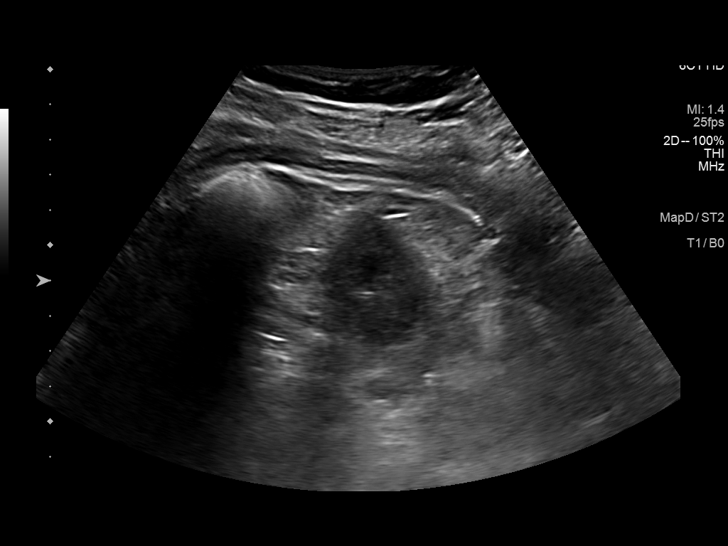
[im 27/41]
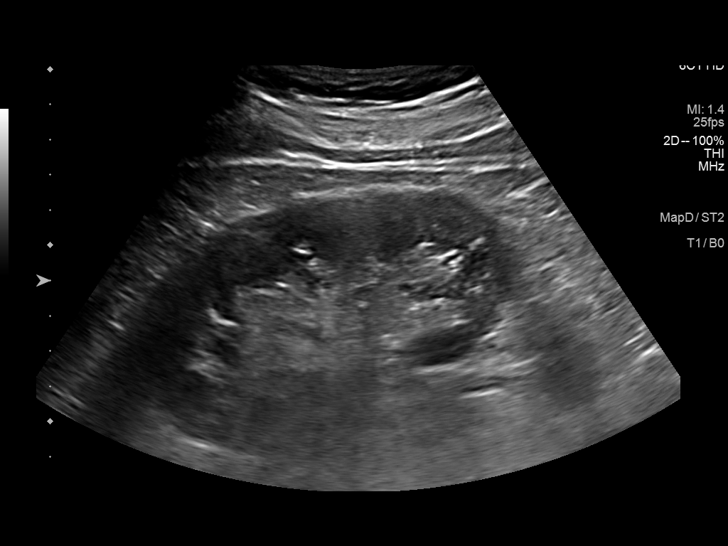
[im 31/41]
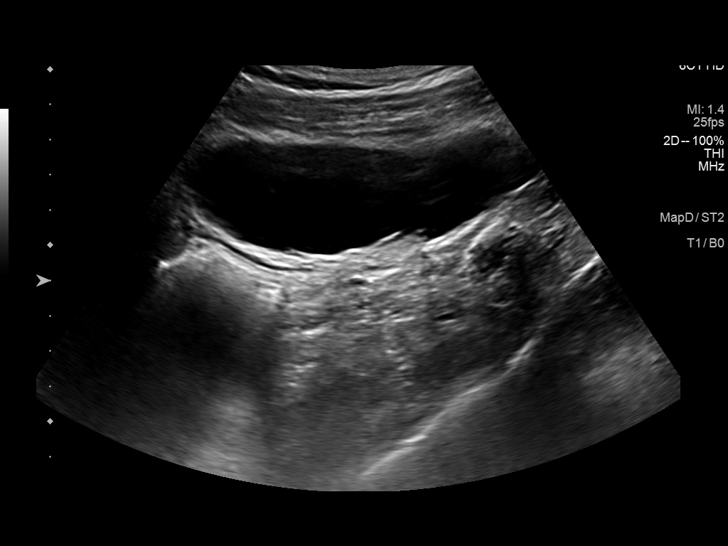
[im 34/41]
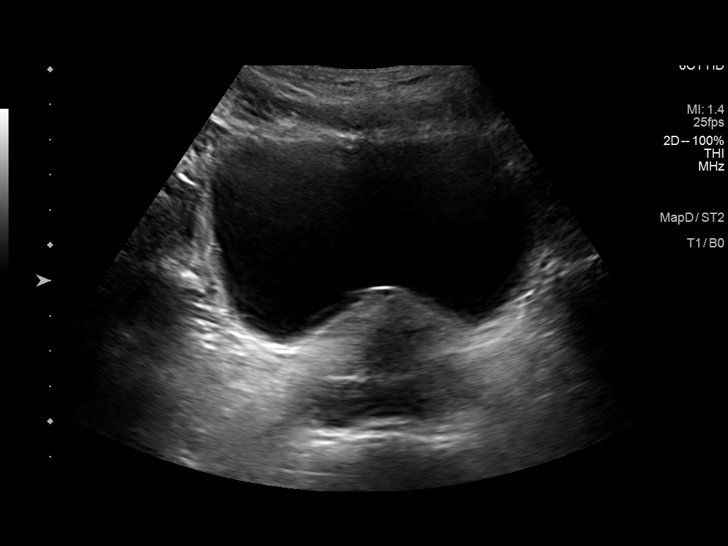
[im 37/41]
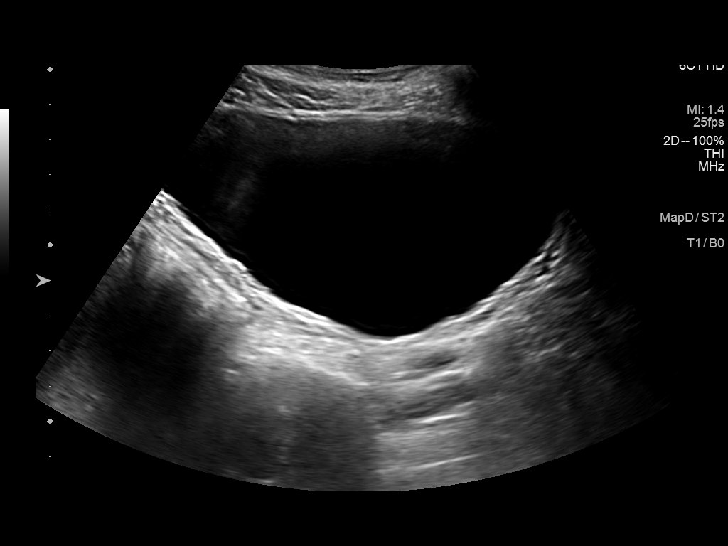
[im 41/41]
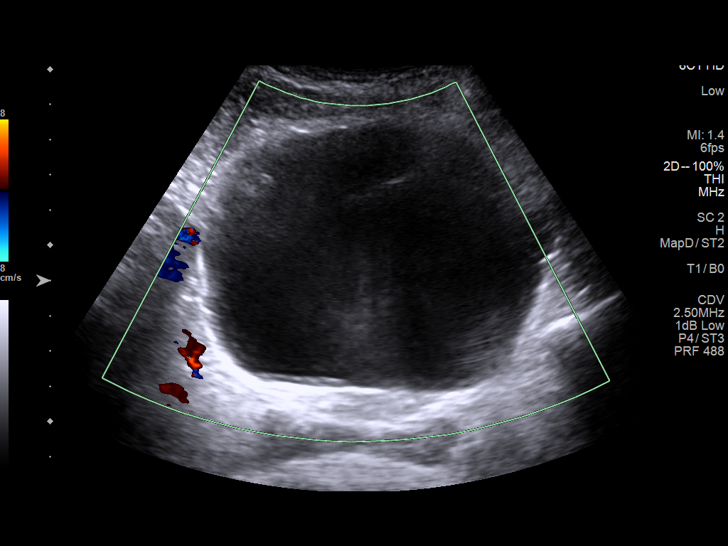

[14 of 25 positions shown; findings below may reference images not displayed]

FINDINGS: Right Kidney:

Renal measurements: 11.1 x 5.4 x 4.7 cm = volume: 147.3 mL.
Echogenicity within normal limits. No nephrolithiasis or
hydronephrosis. No focal renal mass.

Left Kidney:

Renal measurements: 12.2 x 6.3 x 4.5 cm = volume: 181.7 mL.
Echogenicity within normal limits. No nephrolithiasis or
hydronephrosis. No focal renal mass.

Bladder:

Appears normal for degree of bladder distention.

Other:

None.
IMPRESSION: Normal renal ultrasound. No hydronephrosis or other significant
finding.

## 2022-04-24 DIAGNOSIS — Z6827 Body mass index (BMI) 27.0-27.9, adult: Secondary | ICD-10-CM | POA: Diagnosis not present

## 2022-04-24 DIAGNOSIS — M542 Cervicalgia: Secondary | ICD-10-CM | POA: Diagnosis not present

## 2022-05-28 DIAGNOSIS — Z0000 Encounter for general adult medical examination without abnormal findings: Secondary | ICD-10-CM | POA: Diagnosis not present

## 2022-05-28 DIAGNOSIS — M10072 Idiopathic gout, left ankle and foot: Secondary | ICD-10-CM | POA: Diagnosis not present

## 2022-05-28 DIAGNOSIS — Z13220 Encounter for screening for lipoid disorders: Secondary | ICD-10-CM | POA: Diagnosis not present

## 2022-05-28 DIAGNOSIS — Z0001 Encounter for general adult medical examination with abnormal findings: Secondary | ICD-10-CM | POA: Diagnosis not present

## 2022-06-01 DIAGNOSIS — H43393 Other vitreous opacities, bilateral: Secondary | ICD-10-CM | POA: Diagnosis not present

## 2022-06-01 DIAGNOSIS — H04123 Dry eye syndrome of bilateral lacrimal glands: Secondary | ICD-10-CM | POA: Diagnosis not present

## 2022-06-01 DIAGNOSIS — H16223 Keratoconjunctivitis sicca, not specified as Sjogren's, bilateral: Secondary | ICD-10-CM | POA: Diagnosis not present

## 2022-06-01 DIAGNOSIS — H348312 Tributary (branch) retinal vein occlusion, right eye, stable: Secondary | ICD-10-CM | POA: Diagnosis not present

## 2022-07-10 ENCOUNTER — Telehealth: Payer: Self-pay | Admitting: Cardiovascular Disease

## 2022-07-10 NOTE — Telephone Encounter (Signed)
Should only take ASA 81 mg once daily. OK to even stop it completely for a few days while bleeding site heals.

## 2022-07-10 NOTE — Telephone Encounter (Signed)
LMTCB.

## 2022-07-10 NOTE — Telephone Encounter (Signed)
Called pt to relay Dr. Victorino December message. No answer at this time, left a message like her requested.

## 2022-07-10 NOTE — Telephone Encounter (Signed)
Called pt back. He states when he went to the bathroom and strains it bleeds. "It looks like a lot in the toilet. But it is not all the time. It's happened 3-4 times, it's not all the time. I just want to know if I should only take one aspirin instead of the two or keep it the same?" Pt states the blood is bright red and it's only when he strains. Will get message to Dr. Sallyanne Kuster. Pt states recommendations can be left on the machine if he does not pick up.

## 2022-07-10 NOTE — Telephone Encounter (Signed)
Patient is returning call. Please advise? 

## 2022-07-10 NOTE — Telephone Encounter (Signed)
Patient calling because when he was using bathroom their was some blood. Calling to see if he can stop taking the aspirin. Please advise

## 2022-07-13 ENCOUNTER — Ambulatory Visit (INDEPENDENT_AMBULATORY_CARE_PROVIDER_SITE_OTHER): Payer: PPO | Admitting: Gastroenterology

## 2022-07-13 ENCOUNTER — Encounter (INDEPENDENT_AMBULATORY_CARE_PROVIDER_SITE_OTHER): Payer: Self-pay | Admitting: Gastroenterology

## 2022-07-13 VITALS — BP 137/79 | HR 74 | Temp 97.8°F | Ht 70.0 in | Wt 191.8 lb

## 2022-07-13 DIAGNOSIS — K5900 Constipation, unspecified: Secondary | ICD-10-CM | POA: Diagnosis not present

## 2022-07-13 DIAGNOSIS — K625 Hemorrhage of anus and rectum: Secondary | ICD-10-CM | POA: Insufficient documentation

## 2022-07-13 DIAGNOSIS — K649 Unspecified hemorrhoids: Secondary | ICD-10-CM

## 2022-07-13 NOTE — Patient Instructions (Signed)
Increase water intake Take one more hydrocortisone suppository today and then stop Start taking prunes daily, can also continue with prune juice daily

## 2022-07-13 NOTE — Progress Notes (Signed)
Maylon Peppers, M.D. Gastroenterology & Hepatology [facility] [address] [place], [place] [address]  Primary Care Physician: Sharilyn Sites, MD [address] [place] [address]  I will communicate my assessment and recommendations to the referring MD via EMR.  Problems: Rectal bleeding due to hemorrhoids  History of Present Illness: Derrick [name] is a 72 y.o. male with past medical history of hyperlipidemia, hypertension, TIA, hypothyroidism, who presents for evaluation of rectal bleeding.  The patient was last seen on 01/19/2022. At that time, the patient underwent colonoscopy for history of colonic polyps, was found to have internal and external hemorrhoids.  Patient reports that for the last 4 days he presented some episodes of rectal bleeding.  Patient describes that when he defecates there is some stool but it is mostly fresh blood.  States today his stools are normal, which he believes changed after he started using hydrocortisone suppositories x2 for the last 2 days. Denies any dyschezia. No nausea, vomiting, fever, chills, melena, hematemesis, abdominal distention, abdominal pain, diarrhea, jaundice, pruritus or weight loss.  Patient reports that he exercises often and he does not drink too much water during the day which he believes may cause more constipation.    Last Colonoscopy: as above  Past Medical History: Past Medical History:  Diagnosis Date   Arthritis NECK   Cervical radiculopathy    Chest pain, atypical 2005   myoview 09/13/03-normal perfusion   ED (erectile dysfunction)    intolerant to viagra and cialis   Hematuria    History of prostatitis    Hyperlipidemia    Hypertension CARDIOLOGIST- [facility]   STRESS TEST YRS AGO-- OK  ; LAST VISIT 1 MONTH AGO   Hypothyroidism    Palpitations    event monitor 03-2012-showed a single 6 beat run of NSVT; echo 05/25/12-EF55-60%, mild LVH   S/P cervical  spinal fusion    TIA (transient ischemic attack)    Vertigo 2005   nl carotid dopplers 08/09/2003    Past Surgical History: Past Surgical History:  Procedure Laterality Date   ANTERIOR CERVICAL DECOMP/DISCECTOMY FUSION  07-29-2000   C4 - C7   COLONOSCOPY N/A 01/07/2015   Procedure: COLONOSCOPY;  Surgeon: Rogene Houston, MD;  [facility];  Service: Endoscopy;  Laterality: N/A;  145   COLONOSCOPY WITH PROPOFOL N/A 01/19/2022   Procedure: COLONOSCOPY WITH PROPOFOL;  Surgeon: Harvel Quale, MD;  [facility];  Service: Gastroenterology;  Laterality: N/A;  925 ASA 3   CYSTOSCOPY W/ RETROGRADES  12/25/2011   Procedure: CYSTOSCOPY WITH RETROGRADE PYELOGRAM;  Surgeon: Franchot Gallo, MD;  Location: [facility];  Service: Urology;  Laterality: Bilateral;   CYSTOSCOPY W/ URETEROSCOPY  20 YRS AGO  (APPROX. 1990'S)    Family History: Family History  Problem Relation Age of Onset   Heart attack Mother    CVA Father    Healthy Sister    Healthy Son    Healthy Son    Heart attack Brother     Social History: Social History   Tobacco Use  Smoking Status Never  Smokeless Tobacco Never   Social History   Substance and Sexual Activity  Alcohol Use No   Alcohol/week: 0.0 standard drinks of alcohol   Social History   Substance and Sexual Activity  Drug Use No    Allergies: No Known Allergies  Medications: Current Outpatient Medications  Medication Sig Dispense Refill   ASPIRIN LOW DOSE 81 MG EC  tablet TAKE 2 TABLETS(162 MG) BY MOUTH DAILY. SWALLOW WHOLE (Patient taking differently: Take 81 mg by mouth daily.) 180 tablet 3   bisoprolol-hydrochlorothiazide (ZIAC) 5-6.25 MG tablet Take 1 tablet by mouth every evening. 90 tablet 3   diazepam (VALIUM) 5 MG tablet Take 2.5 mg by mouth at bedtime.  0   levothyroxine (SYNTHROID) 75 MCG tablet Take 75 mcg by mouth every morning.     lisinopril (PRINIVIL,ZESTRIL) 5 MG tablet Take 1 tablet  (5 mg total) by mouth daily. 90 tablet 3   No current facility-administered medications for this visit.    Review of Systems: GENERAL: negative for malaise, night sweats HEENT: No changes in hearing or vision, no nose bleeds or other nasal problems. NECK: Negative for lumps, goiter, pain and significant neck swelling RESPIRATORY: Negative for cough, wheezing CARDIOVASCULAR: Negative for chest pain, leg swelling, palpitations, orthopnea GI: SEE HPI MUSCULOSKELETAL: Negative for joint pain or swelling, back pain, and muscle pain. SKIN: Negative for lesions, rash PSYCH: Negative for sleep disturbance, mood disorder and recent psychosocial stressors. HEMATOLOGY Negative for prolonged bleeding, bruising easily, and swollen nodes. ENDOCRINE: Negative for cold or heat intolerance, polyuria, polydipsia and goiter. NEURO: negative for tremor, gait imbalance, syncope and seizures. The remainder of the review of systems is noncontributory.   Physical Exam: BP 137/79 (BP Location: Left Arm, Patient Position: Sitting, Cuff Size: Large)   Pulse 74   Temp 97.8 F (36.6 C) (Temporal)   Ht 5\' 10"  (1.778 m)   Wt 191 lb 12.8 oz (87 kg)   BMI 27.52 kg/m  GENERAL: The patient is AO x3, in no acute distress. HEENT: Head is normocephalic and atraumatic. EOMI are intact. Mouth is well hydrated and without lesions. NECK: Supple. No masses LUNGS: Clear to auscultation. No presence of rhonchi/wheezing/rales. Adequate chest expansion HEART: RRR, normal s1 and s2. ABDOMEN: Soft, nontender, no guarding, no peritoneal signs, and nondistended. BS +. No masses. RECTAL EXAM: deferred EXTREMITIES: Without any cyanosis, clubbing, rash, lesions or edema. NEUROLOGIC: AOx3, no focal motor deficit. SKIN: no jaundice, no rashes  Imaging/Labs: as above  I personally reviewed and interpreted the available labs, imaging and endoscopic files.  Impression and Plan: Derrick [name] is a 72 y.o. male with past medical  history of hyperlipidemia, hypertension, TIA, hypothyroidism, who presents for evaluation of rectal bleeding. The patient presented new onset of painless rectal bleeding for the last few days.  Due to the fact that he had a relatively recent colonoscopy that only showed hemorrhoids, this is the most likely etiology of his bleeding.  Fortunately, he has responded to hydrocortisone suppositories which he can continue until today.  Ideally, he should work on making his stools softer by taking prunes on a daily basis along with the prune juice he drinks.  Also needs to increase his water intake.  The patient understood and agreed.   -Increase water intake -Take one more hydrocortisone suppository today and then stop -Start taking prunes daily, can also continue with prune juice daily  All questions were answered.      Maylon Peppers, MD Gastroenterology and Hepatology [facility]

## 2022-08-17 ENCOUNTER — Other Ambulatory Visit: Payer: Self-pay

## 2022-08-17 MED ORDER — ASPIRIN 81 MG PO TBEC: 81.0000 mg | DELAYED_RELEASE_TABLET | Freq: Every day | ORAL | 1 refills | Status: AC

## 2022-09-15 ENCOUNTER — Encounter: Payer: Self-pay | Admitting: Cardiovascular Disease

## 2022-09-15 ENCOUNTER — Ambulatory Visit: Payer: PPO | Attending: Cardiovascular Disease | Admitting: Cardiovascular Disease

## 2022-09-15 VITALS — BP 132/76 | HR 53 | Ht 70.0 in | Wt 185.4 lb

## 2022-09-15 DIAGNOSIS — E785 Hyperlipidemia, unspecified: Secondary | ICD-10-CM

## 2022-09-15 DIAGNOSIS — I4729 Other ventricular tachycardia: Secondary | ICD-10-CM

## 2022-09-15 DIAGNOSIS — E039 Hypothyroidism, unspecified: Secondary | ICD-10-CM

## 2022-09-15 DIAGNOSIS — N1831 Chronic kidney disease, stage 3a: Secondary | ICD-10-CM

## 2022-09-15 DIAGNOSIS — I10 Essential (primary) hypertension: Secondary | ICD-10-CM

## 2022-09-15 NOTE — Progress Notes (Signed)
Patient ID: Derrick [name], male   DOB: 11/14/1950, 72 y.o.   MRN: [id number]    Cardiology Office Note    Date:  09/20/2022   ID:  Derrick, [name] 02/15/1951, MRN [id number]  PCP:  Sharilyn Sites, MD  Cardiologist:   Sanda Klein, MD   Chief Complaint  Patient presents with   Hyperlipidemia    History of Present Illness:  Derrick [name] is a 72 y.o. male with systemic hypertension, hypercholesterolemia, erectile dysfunction, here for yearly follow-up.  He continues have some problem with his right Achilles tendon but he still walks or rides his bike up to 4 hours a week total.  He is not limited by shortness of breath or chest pain.  He denies dizziness, palpitations, syncope.  He has not had orthopnea, PND, lower extremity edema or intermittent claudication.  Prostate biopsy and recent colonoscopy both showed reassuring results.  He has moderately elevated LDL cholesterol at 137 and borderline HDL of 42.  Triglycerides are normal and Glucose is normal at 93.  Liver function test checked 2023 and again today normal (mildly elevated in 2022).  In 2013, he had an echo that showed normal LV size and systolic function and normal valves. A nuclear stress test in 2005 was a normal test, but he did have brief nonsustained VT during the ECG portion of the test.  In 2013 his Holter monitor showed a single 6 beat run of nonsustained VT.   Past Medical History:  Diagnosis Date   Arthritis NECK   Cervical radiculopathy    Chest pain, atypical 2005   myoview 09/13/03-normal perfusion   ED (erectile dysfunction)    intolerant to viagra and cialis   Hematuria    History of prostatitis    Hyperlipidemia    Hypertension CARDIOLOGIST- [facility]   STRESS TEST YRS AGO-- OK  ; LAST VISIT 1 MONTH AGO   Hypothyroidism    Palpitations    event monitor 03-2012-showed a single 6 beat run of NSVT; echo 05/25/12-EF55-60%, mild LVH   S/P cervical spinal fusion    TIA (transient ischemic attack)     Vertigo 2005   nl carotid dopplers 08/09/2003    Past Surgical History:  Procedure Laterality Date   ANTERIOR CERVICAL DECOMP/DISCECTOMY FUSION  07-29-2000   C4 - C7   COLONOSCOPY N/A 01/07/2015   Procedure: COLONOSCOPY;  Surgeon: Rogene Houston, MD;  [facility];  Service: Endoscopy;  Laterality: N/A;  145   COLONOSCOPY WITH PROPOFOL N/A 01/19/2022   Procedure: COLONOSCOPY WITH PROPOFOL;  Surgeon: Harvel Quale, MD;  [facility];  Service: Gastroenterology;  Laterality: N/A;  925 ASA 3   CYSTOSCOPY W/ RETROGRADES  12/25/2011   Procedure: CYSTOSCOPY WITH RETROGRADE PYELOGRAM;  Surgeon: Franchot Gallo, MD;  Location: [facility];  Service: Urology;  Laterality: Bilateral;   CYSTOSCOPY W/ URETEROSCOPY  20 YRS AGO  (APPROX. 1990'S)    Outpatient Medications Prior to Visit  Medication Sig Dispense Refill   aspirin EC (ASPIRIN LOW DOSE) 81 MG tablet Take 1 tablet (81 mg total) by mouth daily. Swallow whole. 90 tablet 1   bisoprolol-hydrochlorothiazide (ZIAC) 5-6.25 MG tablet Take 1 tablet by mouth every evening. 90 tablet 3   diazepam (VALIUM) 5 MG tablet Take 2.5 mg by mouth at bedtime.  0   levothyroxine (SYNTHROID) 75 MCG tablet Take 75 mcg by mouth every morning.     lisinopril (PRINIVIL,ZESTRIL) 5 MG tablet Take 1 tablet (5  mg total) by mouth daily. 90 tablet 3   OVER THE COUNTER MEDICATION Vit D 3 once per day Magnesium once per day Zinc once per day. Vit C Immune once per day. Black cherry once per day.     No facility-administered medications prior to visit.     Allergies:   Patient has no known allergies.   Social History   Socioeconomic History   Marital status: Married    Spouse name: Not on file   Number of children: 2   Years of education: Not on file   Highest education level: Not on file  Occupational History   Occupation: retired  Tobacco Use   Smoking status: Never   Smokeless tobacco: Never  Vaping Use    Vaping Use: Never used  Substance and Sexual Activity   Alcohol use: No    Alcohol/week: 0.0 standard drinks of alcohol   Drug use: No   Sexual activity: Not on file  Other Topics Concern   Not on file  Social History Narrative   Patient drinks very little caffeine.   Patient is right handed.   Social Determinants of Health   Financial Resource Strain: Not on file  Food Insecurity: Not on file  Transportation Needs: Not on file  Physical Activity: Not on file  Stress: Not on file  Social Connections: Not on file     Family History:  The patient's family history includes CVA in his father; Healthy in his sister, son, and son; Heart attack in his brother and mother.   ROS:   Please see the history of present illness.    ROS All other systems are reviewed and are negative.   PHYSICAL EXAM:   VS:  BP 132/76   Pulse (!) 53   Ht 5\' 10"  (1.778 m)   Wt 185 lb 6.4 oz (84.1 kg)   SpO2 98%   BMI 26.60 kg/m      General: Alert, oriented x3, no distress, appears fit and younger than stated age Head: no evidence of trauma, PERRL, EOMI, no exophtalmos or lid lag, no myxedema, no xanthelasma; normal ears, nose and oropharynx Neck: normal jugular venous pulsations and no hepatojugular reflux; brisk carotid pulses without delay and no carotid bruits Chest: clear to auscultation, no signs of consolidation by percussion or palpation, normal fremitus, symmetrical and full respiratory excursions Cardiovascular: normal position and quality of the apical impulse, regular rhythm, normal first and second heart sounds, no murmurs, rubs or gallops Abdomen: no tenderness or distention, no masses by palpation, no abnormal pulsatility or arterial bruits, normal bowel sounds, no hepatosplenomegaly Extremities: no clubbing, cyanosis or edema; 2+ radial, ulnar and brachial pulses bilaterally; 2+ right femoral, posterior tibial and dorsalis pedis pulses; 2+ left femoral, posterior tibial and dorsalis  pedis pulses; no subclavian or femoral bruits Neurological: grossly nonfocal Psych: Normal mood and affect     Wt Readings from Last 3 Encounters:  09/15/22 185 lb 6.4 oz (84.1 kg)  07/13/22 191 lb 12.8 oz (87 kg)  01/16/22 192 lb 14.4 oz (87.5 kg)      Studies/Labs Reviewed:   EKG:  EKG is ordered today.  Shows sinus bradycardia at 53 bpm but is otherwise normal, QT 422 ms.   Recent Labs: 09/15/2022: ALT 13; BUN 21; Creatinine, Ser 1.20; Potassium 4.5; Sodium 142  05/26/2021 Hemoglobin 16.6, creatinine 1.3, glucose 108, AST 89, ALT 120, TSH 2.61 Cholesterol 201, HDL 42, LDL 139, triglycerides 113 Lipid Panel    Component Value Date/Time  CHOL 194 09/15/2022 1413   TRIG 79 09/15/2022 1413   HDL 42 09/15/2022 1413   CHOLHDL 4.6 09/15/2022 1413   CHOLHDL 4.6 07/27/2016 1019   VLDL 17 07/27/2016 1019   LDLCALC 137 (H) 09/15/2022 1413    ASSESSMENT:    1. Dyslipidemia   2. Essential hypertension   3. NSVT (nonsustained ventricular tachycardia) ([place])   4. Acquired hypothyroidism   5. Stage 3a chronic kidney disease (HCC)      PLAN:  In order of problems listed above:   HLP: he prefers not to take medications.  I recommended a coronary calcium score to see how aggressive we should be with lipid-lowering therapy. HTN: Well-controlled NSVT: Not symptomatic.  VT was infrequently detected on past monitoring devices, but has never really caused palpitations or other symptoms.  He takes a beta-blocker.  He does not have significant structural heart disease.   HypoT4: On levothyroxine supplements CKD: Stable creatinine around 1.3.  Avoid NSAIDs.  He has seen a nephrologist and no specific treatment was recommended.     Medication Adjustments/Labs and Tests Ordered: Current medicines are reviewed at length with the patient today.  Concerns regarding medicines are outlined above.  Medication changes, Labs and Tests ordered today are listed in the Patient Instructions  below. Patient Instructions  Medication Instructions:  No changes *If you need a refill on your cardiac medications before your next appointment, please call your pharmacy*   Lab Work: Lipid profile, CMET-today If you have labs (blood work) drawn today and your tests are completely normal, you will receive your results only by: [place] (if you have MyChart) OR A paper copy in the mail If you have any lab test that is abnormal or we need to change your treatment, we will call you to review the results.   Testing/Procedures: Dr Sallyanne Kuster has ordered a CT coronary calcium score.   Test locations:  [place]   This is $99 out of pocket.   Coronary CalciumScan A coronary calcium scan is an imaging test used to look for deposits of calcium and other fatty materials (plaques) in the inner lining of the blood vessels of the heart (coronary arteries). These deposits of calcium and plaques can partly clog and narrow the coronary arteries without producing any symptoms or warning signs. This puts a person at risk for a heart attack. This test can detect these deposits before symptoms develop. Tell a health care provider about: Any allergies you have. All medicines you are taking, including vitamins, herbs, eye drops, creams, and over-the-counter medicines. Any problems you or family members have had with anesthetic medicines. Any blood disorders you have. Any surgeries you have had. Any medical conditions you have. Whether you are pregnant or may be pregnant. What are the risks? Generally, this is a safe procedure. However, problems may occur, including: Harm to a pregnant woman and her unborn baby. This test involves the use of radiation. Radiation exposure can be dangerous to a pregnant woman and her unborn baby. If you are pregnant, you generally should not have this procedure done. Slight increase in the risk of cancer. This is because of the  radiation involved in the test. What happens before the procedure? No preparation is needed for this procedure. What happens during the procedure? You will undress and remove any jewelry around your neck or chest. You will put on a hospital gown. Sticky electrodes will be placed on your chest. The electrodes will be connected to  an electrocardiogram (ECG) machine to record a tracing of the electrical activity of your heart. A CT scanner will take pictures of your heart. During this time, you will be asked to lie still and hold your breath for 2-3 seconds while a picture of your heart is being taken. The procedure may vary among health care providers and hospitals. What happens after the procedure? You can get dressed. You can return to your normal activities. It is up to you to get the results of your test. Ask your health care provider, or the department that is doing the test, when your results will be ready. Summary A coronary calcium scan is an imaging test used to look for deposits of calcium and other fatty materials (plaques) in the inner lining of the blood vessels of the heart (coronary arteries). Generally, this is a safe procedure. Tell your health care provider if you are pregnant or may be pregnant. No preparation is needed for this procedure. A CT scanner will take pictures of your heart. You can return to your normal activities after the scan is done. This information is not intended to replace advice given to you by your health care provider. Make sure you discuss any questions you have with your health care provider. Document Released: 12/12/2007 Document Revised: 05/04/2016 Document Reviewed: 05/04/2016 Elsevier Interactive Patient Education  2017 [place]: At [facility], you and your health needs are our priority.  As part of our continuing mission to provide you with exceptional heart care, we have created designated Provider Care Teams.   These Care Teams include your primary Cardiologist (physician) and Advanced Practice Providers (APPs -  Physician Assistants and Nurse Practitioners) who all work together to provide you with the care you need, when you need it.  We recommend signing up for the patient portal called "MyChart".  Sign up information is provided on this After Visit Summary.  MyChart is used to connect with patients for Virtual Visits (Telemedicine).  Patients are able to view lab/test results, encounter notes, upcoming appointments, etc.  Non-urgent messages can be sent to your provider as well.   To learn more about what you can do with MyChart, go to [email or web address].    Your next appointment:   1 year(s)  Provider:   Sanda Klein, MD          Signed, Sanda Klein, MD  09/20/2022 3:44 PM    [facility] [place], [place],   [address] Phone: [phone]; Fax: [phone]

## 2022-09-15 NOTE — Patient Instructions (Signed)
Medication Instructions:  No changes *If you need a refill on your cardiac medications before your next appointment, please call your pharmacy*   Lab Work: Lipid profile, CMET-today If you have labs (blood work) drawn today and your tests are completely normal, you will receive your results only by: [place] (if you have MyChart) OR A paper copy in the mail If you have any lab test that is abnormal or we need to change your treatment, we will call you to review the results.   Testing/Procedures: Dr Sallyanne Kuster has ordered a CT coronary calcium score.   Test locations:  [place]   This is $99 out of pocket.   Coronary CalciumScan A coronary calcium scan is an imaging test used to look for deposits of calcium and other fatty materials (plaques) in the inner lining of the blood vessels of the heart (coronary arteries). These deposits of calcium and plaques can partly clog and narrow the coronary arteries without producing any symptoms or warning signs. This puts a person at risk for a heart attack. This test can detect these deposits before symptoms develop. Tell a health care provider about: Any allergies you have. All medicines you are taking, including vitamins, herbs, eye drops, creams, and over-the-counter medicines. Any problems you or family members have had with anesthetic medicines. Any blood disorders you have. Any surgeries you have had. Any medical conditions you have. Whether you are pregnant or may be pregnant. What are the risks? Generally, this is a safe procedure. However, problems may occur, including: Harm to a pregnant woman and her unborn baby. This test involves the use of radiation. Radiation exposure can be dangerous to a pregnant woman and her unborn baby. If you are pregnant, you generally should not have this procedure done. Slight increase in the risk of cancer. This is because of the radiation involved in the  test. What happens before the procedure? No preparation is needed for this procedure. What happens during the procedure? You will undress and remove any jewelry around your neck or chest. You will put on a hospital gown. Sticky electrodes will be placed on your chest. The electrodes will be connected to an electrocardiogram (ECG) machine to record a tracing of the electrical activity of your heart. A CT scanner will take pictures of your heart. During this time, you will be asked to lie still and hold your breath for 2-3 seconds while a picture of your heart is being taken. The procedure may vary among health care providers and hospitals. What happens after the procedure? You can get dressed. You can return to your normal activities. It is up to you to get the results of your test. Ask your health care provider, or the department that is doing the test, when your results will be ready. Summary A coronary calcium scan is an imaging test used to look for deposits of calcium and other fatty materials (plaques) in the inner lining of the blood vessels of the heart (coronary arteries). Generally, this is a safe procedure. Tell your health care provider if you are pregnant or may be pregnant. No preparation is needed for this procedure. A CT scanner will take pictures of your heart. You can return to your normal activities after the scan is done. This information is not intended to replace advice given to you by your health care provider. Make sure you discuss any questions you have with your health care provider. Document Released: 12/12/2007 Document Revised: 05/04/2016 Document  Reviewed: 05/04/2016 Elsevier Interactive Patient Education  2017 [place]: At [facility], you and your health needs are our priority.  As part of our continuing mission to provide you with exceptional heart care, we have created designated Provider Care Teams.  These Care Teams include your  primary Cardiologist (physician) and Advanced Practice Providers (APPs -  Physician Assistants and Nurse Practitioners) who all work together to provide you with the care you need, when you need it.  We recommend signing up for the patient portal called "MyChart".  Sign up information is provided on this After Visit Summary.  MyChart is used to connect with patients for Virtual Visits (Telemedicine).  Patients are able to view lab/test results, encounter notes, upcoming appointments, etc.  Non-urgent messages can be sent to your provider as well.   To learn more about what you can do with MyChart, go to [email or web address].    Your next appointment:   1 year(s)  Provider:   Sanda Klein, MD

## 2022-09-16 ENCOUNTER — Telehealth: Payer: Self-pay | Admitting: Cardiovascular Disease

## 2022-09-16 LAB — COMPREHENSIVE METABOLIC PANEL
ALT: 13 IU/L (ref 0–44)
AST: 18 IU/L (ref 0–40)
Albumin/Globulin Ratio: 2 (ref 1.2–2.2)
Albumin: 4.6 g/dL (ref 3.8–4.8)
Alkaline Phosphatase: 58 IU/L (ref 44–121)
BUN/Creatinine Ratio: 18 (ref 10–24)
BUN: 21 mg/dL (ref 8–27)
Bilirubin Total: 0.8 mg/dL (ref 0.0–1.2)
CO2: 24 mmol/L (ref 20–29)
Calcium: 9.2 mg/dL (ref 8.6–10.2)
Chloride: 103 mmol/L (ref 96–106)
Creatinine, Ser: 1.2 mg/dL (ref 0.76–1.27)
Globulin, Total: 2.3 g/dL (ref 1.5–4.5)
Glucose: 93 mg/dL (ref 70–99)
Potassium: 4.5 mmol/L (ref 3.5–5.2)
Sodium: 142 mmol/L (ref 134–144)
Total Protein: 6.9 g/dL (ref 6.0–8.5)
eGFR: 64 mL/min/{1.73_m2} (ref >59)

## 2022-09-16 LAB — LIPID PANEL
Chol/HDL Ratio: 4.6 ratio (ref 0.0–5.0)
Cholesterol, Total: 194 mg/dL (ref 100–199)
HDL: 42 mg/dL (ref >39)
LDL Chol Calc (NIH): 137 mg/dL — ABNORMAL HIGH (ref 0–99)
Triglycerides: 79 mg/dL (ref 0–149)
VLDL Cholesterol Cal: 15 mg/dL (ref 5–40)

## 2022-09-16 NOTE — Telephone Encounter (Signed)
Need more information regarding what insurance has stated

## 2022-09-16 NOTE — Telephone Encounter (Signed)
Pt stated that they talked about the pt having a CT Calcium Scan. Pt stated that they were supposed to pay out of pocket $90, but pt spoke with his insurance [employer] and they stated that if the provider codes it to where he recommends the pt to get the scan. [employer] will pay for the test, so the patient wouldn't have to pay out of pocket. Please Advise

## 2022-09-16 NOTE — Telephone Encounter (Signed)
Spoke with patient and he states that his insurance states they will cover the test if coded correctly.  I noted that we order the test and give diagnosis that is reasoning for the test in question.  It is then to the insurance company to approve or deny based on his coverage.  He states understanding and will fax the order to his insurance to see if it is approved.  I advised if they have a specific diagnosis or information needed to be approved, then please let us know.

## 2022-09-20 ENCOUNTER — Encounter: Payer: Self-pay | Admitting: Cardiovascular Disease

## 2022-09-24 ENCOUNTER — Telehealth: Payer: Self-pay | Admitting: Cardiovascular Disease

## 2022-09-24 NOTE — Telephone Encounter (Signed)
Patient stated he is concerned his Cardiac Scoring test uses dye.  Patient stated he had a bad reaction to the dye and will not do this test if he has to have dye.

## 2022-09-24 NOTE — Telephone Encounter (Signed)
Returned call to patient and advised him that CT Cal Score does not use IV Contrast. Patient aware and verbalized understanding.   Advised patient to call back to office with any issues, questions, or concerns. Patient verbalized understanding.

## 2022-09-25 NOTE — Telephone Encounter (Signed)
Thanks for clearing that up

## 2022-10-08 ENCOUNTER — Ambulatory Visit (HOSPITAL_BASED_OUTPATIENT_CLINIC_OR_DEPARTMENT_OTHER): Payer: PPO

## 2022-10-23 ENCOUNTER — Ambulatory Visit: Payer: PPO | Admitting: Cardiovascular Disease

## 2022-11-16 ENCOUNTER — Ambulatory Visit (HOSPITAL_BASED_OUTPATIENT_CLINIC_OR_DEPARTMENT_OTHER)
Admission: RE | Admit: 2022-11-16 | Discharge: 2022-11-16 | Disposition: A | Discharge status: Home / Self Care | Payer: PPO | Source: Ambulatory Visit | Attending: Cardiovascular Disease | Admitting: Cardiovascular Disease

## 2022-11-16 DIAGNOSIS — E785 Hyperlipidemia, unspecified: Secondary | ICD-10-CM | POA: Insufficient documentation

## 2022-11-17 ENCOUNTER — Telehealth: Payer: Self-pay | Admitting: Cardiovascular Disease

## 2022-11-17 DIAGNOSIS — I77819 Aortic ectasia, unspecified site: Secondary | ICD-10-CM

## 2022-11-17 NOTE — Telephone Encounter (Signed)
Derrick Fair, MD 11/16/2022  1:36 PM EDT     Is on the calcium score.  There is actually slightly less than average for man his age.  I do not think that we need to intensify cholesterol-lowering treatment.  On the other hand we identified that he has a mild dilation of the ascending aorta at 4.6 cm.  This should be reevaluated in 1 year with a CT angiogram of the aorta with contrast.    Patient states he just wants to make sure he is still safe to ride his bicycle 30 miles daily or sometimes 50  miles over the weekend. Advised patient this should not be an issues, patient would like to confirm with Dr. Salena Saner. Patient also concerned about IV contrast as he states that he had chills and didn't feel well after the last time he received contrast. Patient would like to know if scan can be done WO but if not and Dr. Salena Saner recommends with contrast he is amenable. Will forward to Dr. Salena Saner for review.

## 2022-11-17 NOTE — Telephone Encounter (Signed)
Patient is calling requesting a callback to discuss his CT results. He is also wanting to know if he can continue his exercise due to them.  Please advise.

## 2022-11-17 NOTE — Telephone Encounter (Signed)
It is safe and recommended that he continue to ride his bike.  If he prefers, we can do the aortic study with an MRI instead of CT. That uses a different kind of contrast that is unlikley to give the same side effects.  He may not like it as much if he has claustrophobia.  The results are equally useful with CT or MRI, as long as we use intravenous contrast with the CT.

## 2022-11-17 NOTE — Telephone Encounter (Signed)
Advised patient of the below from Dr. Salena Saner. Patient aware and verbalized understanding. Patient is willing to do CTA chest aorta in 1 year- order placed.   Patient would like to know if its safe for him to continue to take cialis- patient reports he takes 5mg  as needed due to prostate and urinary issues. Advised patient I would forward to Dr. Salena Saner for him to review and advise. Patient verbalized understanding.

## 2022-11-17 NOTE — Telephone Encounter (Signed)
Yes, that medication has no effect on the aortic dilation

## 2022-11-17 NOTE — Telephone Encounter (Signed)
Croitoru, Mihai, MD    Yes, that medication has no effect on the aortic dilation    Gave this pt the message above. He verbalized understanding

## 2022-11-25 ENCOUNTER — Telehealth: Payer: Self-pay | Admitting: Cardiovascular Disease

## 2022-11-25 DIAGNOSIS — I77819 Aortic ectasia, unspecified site: Secondary | ICD-10-CM

## 2022-11-25 NOTE — Telephone Encounter (Signed)
Pt would like a callback regarding a stress test and his cardiac score he had done. Please advise

## 2022-11-25 NOTE — Telephone Encounter (Signed)
That's fine: please schedule CT Angio of the aorta in early November

## 2022-11-25 NOTE — Telephone Encounter (Signed)
LVM to call office regarding change for test

## 2022-11-25 NOTE — Telephone Encounter (Signed)
Patient called and states he would like his repeat CT done in 6 months rather than a year.  He states it is something he worries about. He would like to have it done prior to his 6 months appointment.  [facility]  He ask to please ask the doctor if this would be OK.  Advised it may have issues with insurance but if appt moved up and issues, let us know.  Please advise if ok to have in 6 months.

## 2022-11-30 NOTE — Telephone Encounter (Signed)
Patient is returning call and he states he has a question as well.

## 2022-12-01 NOTE — Telephone Encounter (Signed)
Pt reports that he wants access/to pictures that were taken during his CT scan. Informed him that he would have to request through medical records. Sending him information over MyChart to ask for records.

## 2023-01-05 LAB — LAB REPORT - SCANNED: EGFR: 66

## 2023-05-17 ENCOUNTER — Ambulatory Visit (HOSPITAL_BASED_OUTPATIENT_CLINIC_OR_DEPARTMENT_OTHER)
Admission: RE | Admit: 2023-05-17 | Discharge: 2023-05-17 | Disposition: A | Discharge status: Home / Self Care | Payer: PPO | Source: Ambulatory Visit | Attending: Cardiovascular Disease | Admitting: Cardiovascular Disease

## 2023-05-17 DIAGNOSIS — I77819 Aortic ectasia, unspecified site: Secondary | ICD-10-CM | POA: Diagnosis present

## 2023-05-17 LAB — POCT I-STAT CREATININE: Creatinine, Ser: 1.3 mg/dL — ABNORMAL HIGH (ref 0.61–1.24)

## 2023-05-17 MED ORDER — IOHEXOL 350 MG/ML SOLN
100.0000 mL | Freq: Once | INTRAVENOUS | Status: AC | PRN
  Administered 2023-05-17: 80 mL via INTRAVENOUS

## 2023-05-21 ENCOUNTER — Encounter: Payer: Self-pay | Admitting: Cardiovascular Disease

## 2023-05-21 ENCOUNTER — Ambulatory Visit: Payer: PPO | Attending: Cardiovascular Disease | Admitting: Cardiovascular Disease

## 2023-05-21 VITALS — BP 124/72 | HR 53 | Ht 70.0 in | Wt 182.0 lb

## 2023-05-21 DIAGNOSIS — I10 Essential (primary) hypertension: Secondary | ICD-10-CM

## 2023-05-21 DIAGNOSIS — I4729 Other ventricular tachycardia: Secondary | ICD-10-CM | POA: Diagnosis not present

## 2023-05-21 DIAGNOSIS — I7121 Aneurysm of the ascending aorta, without rupture: Secondary | ICD-10-CM | POA: Diagnosis not present

## 2023-05-21 DIAGNOSIS — E7800 Pure hypercholesterolemia, unspecified: Secondary | ICD-10-CM

## 2023-05-21 DIAGNOSIS — G459 Transient cerebral ischemic attack, unspecified: Secondary | ICD-10-CM

## 2023-05-21 DIAGNOSIS — N1831 Chronic kidney disease, stage 3a: Secondary | ICD-10-CM

## 2023-05-21 DIAGNOSIS — E039 Hypothyroidism, unspecified: Secondary | ICD-10-CM

## 2023-05-21 NOTE — Patient Instructions (Signed)

## 2023-05-22 ENCOUNTER — Encounter: Payer: Self-pay | Admitting: Cardiovascular Disease

## 2023-05-22 NOTE — Progress Notes (Signed)
Patient ID: Derrick [name], male   DOB: [birth year], 72 y.o.   MRN: [id number]     Cardiology Office Note    Date:  05/22/2023   ID:  Derrick [name], Derrick [name] Nov 07, 1950, MRN [id number]  PCP:  Shawnie Dapper, PA-C  Cardiologist:   Thurmon Fair, MD   Chief Complaint  Patient presents with   Ascending aortic dilation    History of Present Illness:  Derrick [name] is a 72 y.o. male with systemic hypertension, hypercholesterolemia, erectile dysfunction, here for follow-up after undergoing a CT of the chest for a dilated ascending aorta.Marland Kitchen  He is doing well.  He is up to the exercise time on his bike and has managed to lose some weight, he has not had shortness of breath or chest pain with activity or at rest.  He denies palpitations or syncope.   In May of this year a coronary calcium score identified a dilated ascending aorta measuring up to 4.7 cm.  The calcium score was 105 (40th percentile for age, but with all the calcification in the LAD artery).  His CT angiogram which was performed 05/17/2023 has not yet been officially interpreted, on my measurements the largest diameter of the ascending aorta is 4.4 x 4.5 cm.  He has moderately elevated LDL cholesterol at 137 and borderline HDL of 42.  Triglycerides are normal and Glucose is normal at 93.  Liver function test checked 2023 and again today normal (mildly elevated in 2022).  In 2013, he had an echo that showed normal LV size and systolic function and normal valves. A nuclear stress test in 2005 was a normal test, but he did have brief nonsustained VT during the ECG portion of the test.  In 2013 his Holter monitor showed a single 6 beat run of nonsustained VT.   Past Medical History:  Diagnosis Date   Arthritis NECK   Cervical radiculopathy    Chest pain, atypical 2005   myoview 09/13/03-normal perfusion   ED (erectile dysfunction)    intolerant to viagra and cialis   Hematuria    History of prostatitis    Hyperlipidemia    Hypertension  CARDIOLOGIST- [facility]   STRESS TEST YRS AGO-- OK  ; LAST VISIT 1 MONTH AGO   Hypothyroidism    Palpitations    event monitor 03-2012-showed a single 6 beat run of NSVT; echo 05/25/12-EF55-60%, mild LVH   S/P cervical spinal fusion    TIA (transient ischemic attack)    Vertigo 2005   nl carotid dopplers 08/09/2003    Past Surgical History:  Procedure Laterality Date   ANTERIOR CERVICAL DECOMP/DISCECTOMY FUSION  07-29-2000   C4 - C7   COLONOSCOPY N/A 01/07/2015   Procedure: COLONOSCOPY;  Surgeon: Malissa Hippo, MD;  [facility];  Service: Endoscopy;  Laterality: N/A;  145   COLONOSCOPY WITH PROPOFOL N/A 01/19/2022   Procedure: COLONOSCOPY WITH PROPOFOL;  Surgeon: Dolores Frame, MD;  Location: [facility];  Service: Gastroenterology;  Laterality: N/A;  925 ASA 3   CYSTOSCOPY W/ RETROGRADES  12/25/2011   Procedure: CYSTOSCOPY WITH RETROGRADE PYELOGRAM;  Surgeon: Marcine Matar, MD;  [facility];  Service: Urology;  Laterality: Bilateral;   CYSTOSCOPY W/ URETEROSCOPY  20 YRS AGO  (APPROX. 1990'S)    Outpatient Medications Prior to Visit  Medication Sig Dispense Refill   ascorbic acid (VITAMIN C) 1000 MG tablet Take 1,000 mg by mouth daily.     aspirin EC (ASPIRIN LOW  DOSE) 81 MG tablet Take 1 tablet (81 mg total) by mouth daily. Swallow whole. 90 tablet 1   bisoprolol-hydrochlorothiazide (ZIAC) 5-6.25 MG tablet Take 1 tablet by mouth every evening. 90 tablet 3   diazepam (VALIUM) 5 MG tablet Take 2.5 mg by mouth at bedtime.  0   Flaxseed, Linseed, (FLAXSEED OIL) 1000 MG CAPS Take 1 capsule by mouth daily at 6 (six) AM.     KRILL OIL PO Take 2 capsules by mouth daily at 6 (six) AM.     levothyroxine (SYNTHROID) 75 MCG tablet Take 75 mcg by mouth every morning.     lisinopril (PRINIVIL,ZESTRIL) 5 MG tablet Take 1 tablet (5 mg total) by mouth daily. 90 tablet 3   Magnesium 500 MG CAPS Take 1 capsule by mouth at bedtime.      Zinc 30 MG TABS Take 30 mg by mouth daily at 6 (six) AM.     meclizine (ANTIVERT) 25 MG tablet Take 25 mg by mouth daily as needed. (Patient not taking: Reported on 05/21/2023)     OVER THE COUNTER MEDICATION Vit D 3 once per day Magnesium once per day Zinc once per day. Vit C Immune once per day. Black cherry once per day.     tadalafil (CIALIS) 10 MG tablet Take 10 mg by mouth daily as needed. (Patient not taking: Reported on 05/21/2023)     No facility-administered medications prior to visit.     Allergies:   Patient has no known allergies.   Social History   Socioeconomic History   Marital status: Married    Spouse name: Not on file   Number of children: 2   Years of education: Not on file   Highest education level: Not on file  Occupational History   Occupation: retired  Tobacco Use   Smoking status: Never   Smokeless tobacco: Never  Vaping Use   Vaping status: Never Used  Substance and Sexual Activity   Alcohol use: No    Alcohol/week: 0.0 standard drinks of alcohol   Drug use: No   Sexual activity: Not on file  Other Topics Concern   Not on file  Social History Narrative   Patient drinks very little caffeine.   Patient is right handed.   Social Determinants of Health   Financial Resource Strain: Low Risk  (05/03/2023)   Received [facility]   Overall Financial Resource Strain (CARDIA)    Difficulty of Paying Living Expenses: Not hard at all  Food Insecurity: No Food Insecurity (05/03/2023)   Received from [facility]   Hunger Vital Sign    Worried About Running Out of Food in the Last Year: Never true    Ran Out of Food in the Last Year: Never true  Transportation Needs: No Transportation Needs (05/03/2023)   Received [facility] - Transportation    Lack of Transportation (Medical): No    Lack of Transportation (Non-Medical): No  Physical Activity: Sufficiently Active (05/03/2023)   Received from [facility]   Exercise Vital  Sign    Days of Exercise per Week: 4 days    Minutes of Exercise per Session: 150+ min  Stress: No Stress Concern Present (05/03/2023)   Received from [facility] - Occupational Stress Questionnaire    Feeling of Stress : Not at all  Social Connections: Socially Integrated (05/03/2023)   Received from [facility]   Social Network    How would you  rate your social network (family, work, friends)?: Good participation with social networks     Family History:  The patient's family history includes CVA in his father; Healthy in his sister, son, and son; Heart attack in his brother and mother.   ROS:   Please see the history of present illness.    ROS All other systems are reviewed and are negative.   PHYSICAL EXAM:   VS:  BP 124/72 (BP Location: Left Arm, Patient Position: Sitting, Cuff Size: Normal)   Pulse (!) 53   Ht 5\' 10"  (1.778 m)   Wt 182 lb (82.6 kg)   BMI 26.11 kg/m      General: Alert, oriented x3, no distress, appears fit and younger than stated age Head: no evidence of trauma, PERRL, EOMI, no exophtalmos or lid lag, no myxedema, no xanthelasma; normal ears, nose and oropharynx Neck: normal jugular venous pulsations and no hepatojugular reflux; brisk carotid pulses without delay and no carotid bruits Chest: clear to auscultation, no signs of consolidation by percussion or palpation, normal fremitus, symmetrical and full respiratory excursions Cardiovascular: normal position and quality of the apical impulse, regular rhythm, normal first and second heart sounds, no murmurs, rubs or gallops Abdomen: no tenderness or distention, no masses by palpation, no abnormal pulsatility or arterial bruits, normal bowel sounds, no hepatosplenomegaly Extremities: no clubbing, cyanosis or edema; 2+ radial, ulnar and brachial pulses bilaterally; 2+ right femoral, posterior tibial and dorsalis pedis pulses; 2+ left femoral, posterior tibial and  dorsalis pedis pulses; no subclavian or femoral bruits Neurological: grossly nonfocal Psych: Normal mood and affect     Wt Readings from Last 3 Encounters:  05/21/23 182 lb (82.6 kg)  09/15/22 185 lb 6.4 oz (84.1 kg)  07/13/22 191 lb 12.8 oz (87 kg)      Studies/Labs Reviewed:   EKG:   EKG Interpretation Date/Time:  Friday May 21 2023 09:51:10 EST Ventricular Rate:  53 PR Interval:  194 QRS Duration:  98 QT Interval:  428 QTC Calculation: 401 R Axis:   41  Text Interpretation: Sinus bradycardia Low voltage QRS When compared with ECG of 10-Dec-2014 19:54, No significant change since last tracing Confirmed by Rei Contee [phone]) on 05/22/2023 3:35:12 PM         Recent Labs: 09/15/2022: ALT 13; BUN 21; Potassium 4.5; Sodium 142 05/17/2023: Creatinine, Ser 1.30  05/26/2021 Hemoglobin 16.6, creatinine 1.3, glucose 108, AST 89, ALT 120, TSH 2.61 Cholesterol 201, HDL 42, LDL 139, triglycerides 113 Lipid Panel    Component Value Date/Time   CHOL 194 09/15/2022 1413   TRIG 79 09/15/2022 1413   HDL 42 09/15/2022 1413   CHOLHDL 4.6 09/15/2022 1413   CHOLHDL 4.6 07/27/2016 1019   VLDL 17 07/27/2016 1019   LDLCALC 137 (H) 09/15/2022 1413    ASSESSMENT:    1. Essential hypertension, benign   2. Aneurysm of ascending aorta without rupture (HCC)   3. Hypercholesterolemia   4. NSVT (nonsustained ventricular tachycardia) (HCC)   5. Acquired hypothyroidism   6. Stage 3a chronic kidney disease (HCC)      PLAN:  In order of problems listed above:   Ascending aortic aneurysm: Asymptomatic, discovered incidentally.  Will wait for the official interpretation from the radiologist, but he appears to have a moderate-sized aneurysm of the ascending aorta.  Plan to repeat a CT angiogram in 12 months time.  If the size is stable, we will then follow it every 3 years or so. HLP: This is mild and he  prefers not to take medications.  Since his coronary calcium score is  actually a little less than average for age and gender, that is not unreasonable.  Encouraged him to continue following a healthy diet and exercising regularly. HTN: Controlled.  Information about his ascending aorta it is important to keep his blood pressure well under 130/80.  He is on an ACE inhibitor and beta-blocker.  Some theoretical benefits to angiotensin receptor blocker over ACE inhibitor, but did not make any changes to his medications today. NSVT: Has never been symptomatic.  VT was infrequently detected on past monitoring devices, but has never really caused palpitations or other symptoms.  He takes a beta-blocker.  He does not have significant structural heart disease.   HypoT4: On levothyroxine supplements.  He is clinically euthyroid and TSH was normal at 3.6 a couple of months ago. CKD: Stable creatinine around 1.3 (has been around this level as far back as 2060), GFR around 55-60.  Avoid NSAIDs.  He has seen a nephrologist and no specific treatment was recommended.     Medication Adjustments/Labs and Tests Ordered: Current medicines are reviewed at length with the patient today.  Concerns regarding medicines are outlined above.  Medication changes, Labs and Tests ordered today are listed in the Patient Instructions below. Patient Instructions  Medication Instructions:  No changes *If you need a refill on your cardiac medications before your next appointment, please call your pharmacy*   Follow-Up: At [facility], you and your health needs are our priority.  As part of our continuing mission to provide you with exceptional heart care, we have created designated Provider Care Teams.  These Care Teams include your primary Cardiologist (physician) and Advanced Practice Providers (APPs -  Physician Assistants and Nurse Practitioners) who all work together to provide you with the care you need, when you need it.  We recommend signing up for the patient portal called  "MyChart".  Sign up information is provided on this After Visit Summary.  MyChart is used to connect with patients for Virtual Visits (Telemedicine).  Patients are able to view lab/test results, encounter notes, upcoming appointments, etc.  Non-urgent messages can be sent to your provider as well.   To learn more about what you can do with MyChart, go to [email or web address].    Your next appointment:   1 year(s)  Provider:   Thurmon Fair, MD          Signed, Thurmon Fair, MD  05/22/2023 3:47 PM    [facility] [address] [place], [place], [place]  [address] Phone: [phone]; Fax: [phone]

## 2023-05-26 ENCOUNTER — Other Ambulatory Visit (HOSPITAL_BASED_OUTPATIENT_CLINIC_OR_DEPARTMENT_OTHER): Payer: Self-pay | Admitting: Emergency Medicine

## 2023-05-26 DIAGNOSIS — I7121 Aneurysm of the ascending aorta, without rupture: Secondary | ICD-10-CM

## 2023-07-05 ENCOUNTER — Encounter (INDEPENDENT_AMBULATORY_CARE_PROVIDER_SITE_OTHER): Payer: 59 | Admitting: Ophthalmology

## 2023-07-12 ENCOUNTER — Encounter (INDEPENDENT_AMBULATORY_CARE_PROVIDER_SITE_OTHER): Payer: PPO | Admitting: Ophthalmology

## 2023-07-12 DIAGNOSIS — I10 Essential (primary) hypertension: Secondary | ICD-10-CM | POA: Diagnosis not present

## 2023-07-12 DIAGNOSIS — H35033 Hypertensive retinopathy, bilateral: Secondary | ICD-10-CM | POA: Diagnosis not present

## 2023-07-12 DIAGNOSIS — H348312 Tributary (branch) retinal vein occlusion, right eye, stable: Secondary | ICD-10-CM

## 2023-07-12 DIAGNOSIS — H43813 Vitreous degeneration, bilateral: Secondary | ICD-10-CM

## 2023-10-28 ENCOUNTER — Telehealth: Payer: Self-pay | Admitting: Cardiovascular Disease

## 2023-10-28 NOTE — Telephone Encounter (Signed)
 Called pt back regarding his concern for a friend of his, Linna Richard, who has been seen at [facility] recently.  Mr. Moresi would like Dr. Alvis Ba to see his friend.  Apparently the PCP is going to refer him to [facility], to Dr. Tita Form.   Made Mr. Davia aware that Leonette Ramal (Dr. Glenice Lang nurse) is back in the office tomorrow (10/29/2023) and I will send her the message.

## 2023-10-28 NOTE — Telephone Encounter (Signed)
 Pt wants to speak to Dr Alvis Ba nurse.

## 2023-11-02 NOTE — Telephone Encounter (Signed)
 Called patient back and he reports that his friend is trying to get in to see Dr Alvis Ba; he really wants him to be seen by Dr C because he feels that he will get him straightened out. He just kept praising Dr Alvis Ba.  I asked for his friend's number and informed him that I would call him in the next 30 minutes. He said he would let him know.

## 2023-11-15 ENCOUNTER — Other Ambulatory Visit: Payer: PPO

## 2023-12-30 ENCOUNTER — Telehealth: Payer: Self-pay | Admitting: Cardiovascular Disease

## 2023-12-30 DIAGNOSIS — E7800 Pure hypercholesterolemia, unspecified: Secondary | ICD-10-CM

## 2023-12-30 DIAGNOSIS — F1921 Other psychoactive substance dependence, in remission: Secondary | ICD-10-CM

## 2023-12-30 NOTE — Telephone Encounter (Signed)
 Orders placed for CMP and lipid panel. Pt will have drawn at end of October here at [facility] lab.

## 2023-12-30 NOTE — Telephone Encounter (Signed)
 Yes perfectly fine to get a c-Met and lipid profile at that time.

## 2023-12-30 NOTE — Telephone Encounter (Signed)
 Patient wants a call back to discuss upcoming lab test.

## 2023-12-30 NOTE — Telephone Encounter (Signed)
 Pt asking if he can have his cholesterol checked when has blood work end of October, prior to his CT.  Says that it is for his wellness visit later in November.   Aware I will forward to Dr. Francyne for approval to have lipid & CMET at the end of October. He understands we will let him know once MD had reviewed.  Patient is agreeable to plan.

## 2024-01-04 ENCOUNTER — Telehealth: Payer: Self-pay

## 2024-01-04 NOTE — Telephone Encounter (Signed)
 Patient called and advised he wished to see Dr. Matilda. I made him him aware we needed a referral. He advised he was a patient of his at [facility] but sees the provider who took his place and wanted Dr. Matilda to look over his paperwork from his last visit. I made him aware that we would need the records from [facility] since we dont have access to them. He advised he did not want [facility] to know he was getting a second opinion. I made him aware that he could print the documentation if he had access to it. He advised he would just continue seeing [facility].

## 2024-02-15 NOTE — Progress Notes (Signed)
 [facility] [place] [facility] Note   Patient Name: Derrick [name]   Patient DOB: 11-13-1950                Patient Age: 73 y.o.     Reason for Visit: Charbel is here unaccompanied to pick up both his recently repaired TH Adv 6 RIE aids.     He has seen [place], AuD, CCC-A previously.    Procedure: He does not want the aids paired to his phone for calls.  He does have the [facility] app that has not been working well for him.   I paired the aids to his app manually.    Follow-up: Recommend hearing aid follow-up as  needed.  His warranty expires 07/2024.   Billing: $50.00 programming cosmetic fee.    Current Acoustic Coupling: Receiver Size and Strength: 3P- right and 3P- left Dome Size and Style: Small power sleeve- right and Small power sleeve- left.   Retention Lock: Yes bilateral

## 2024-03-03 ENCOUNTER — Telehealth: Payer: Self-pay | Admitting: Cardiovascular Disease

## 2024-03-03 NOTE — Telephone Encounter (Signed)
 Patient would like to know if he needs to fast for his upcoming labs before November appt. Please advise

## 2024-03-03 NOTE — Telephone Encounter (Signed)
 Spoke with pt and advised he should come fasting for labs prior to his appointment with Dr Francyne.

## 2024-03-29 ENCOUNTER — Other Ambulatory Visit (HOSPITAL_COMMUNITY): Payer: Self-pay

## 2024-03-29 MED ORDER — BISOPROLOL-HYDROCHLOROTHIAZIDE 5-6.25 MG PO TABS
1.0000 | ORAL_TABLET | Freq: Two times a day (BID) | ORAL | 3 refills | Status: AC
  Filled 2024-03-31: qty 180, 90d supply, fill #0
  Filled 2024-06-22: qty 180, 90d supply, fill #1

## 2024-03-29 MED ORDER — LISINOPRIL 5 MG PO TABS
5.0000 mg | ORAL_TABLET | Freq: Every day | ORAL | 3 refills | Status: DC
  Filled 2024-03-31: qty 90, 90d supply, fill #0

## 2024-03-29 MED ORDER — DIAZEPAM 5 MG PO TABS
5.0000 mg | ORAL_TABLET | Freq: Two times a day (BID) | ORAL | 5 refills | Status: AC
  Filled 2024-03-31: qty 60, 30d supply, fill #0

## 2024-03-29 MED ORDER — LEVOTHYROXINE SODIUM 75 MCG PO TABS
75.0000 ug | ORAL_TABLET | Freq: Every day | ORAL | 3 refills | Status: AC
  Filled 2024-03-31: qty 90, 90d supply, fill #0
  Filled 2024-06-22: qty 90, 90d supply, fill #1

## 2024-03-30 ENCOUNTER — Telehealth: Payer: Self-pay | Admitting: Cardiovascular Disease

## 2024-03-30 DIAGNOSIS — I7121 Aneurysm of the ascending aorta, without rupture: Secondary | ICD-10-CM

## 2024-03-30 DIAGNOSIS — E039 Hypothyroidism, unspecified: Secondary | ICD-10-CM

## 2024-03-30 DIAGNOSIS — I10 Essential (primary) hypertension: Secondary | ICD-10-CM

## 2024-03-30 DIAGNOSIS — I4729 Other ventricular tachycardia: Secondary | ICD-10-CM

## 2024-03-30 NOTE — Telephone Encounter (Signed)
 Lately, please add TSH and free T4

## 2024-03-30 NOTE — Telephone Encounter (Signed)
 Patient is having CMET and Lipids drawn soon at our office. He would like to know if Dr. JAYSON would be willing to recheck his thyroid  levels. He states that Dr. Tommas checked it recently and his levels were off. Patient states that she wanted to recheck them again in 30 days. He would prefer to have all his labs drawn at the same time.

## 2024-03-30 NOTE — Telephone Encounter (Signed)
 Patient is requesting an order to check his thyroid .

## 2024-03-31 ENCOUNTER — Other Ambulatory Visit (HOSPITAL_COMMUNITY): Payer: Self-pay

## 2024-03-31 ENCOUNTER — Other Ambulatory Visit: Payer: Self-pay

## 2024-04-12 ENCOUNTER — Encounter (INDEPENDENT_AMBULATORY_CARE_PROVIDER_SITE_OTHER): Payer: Self-pay | Admitting: Gastroenterology

## 2024-04-26 ENCOUNTER — Ambulatory Visit: Payer: Self-pay | Admitting: Cardiovascular Disease

## 2024-04-26 ENCOUNTER — Telehealth: Payer: Self-pay | Admitting: Cardiovascular Disease

## 2024-04-26 LAB — BASIC METABOLIC PANEL WITH GFR
BUN/Creatinine Ratio: 18 (ref 10–24)
BUN: 21 mg/dL (ref 8–27)
CO2: 25 mmol/L (ref 20–29)
Calcium: 9 mg/dL (ref 8.6–10.2)
Chloride: 101 mmol/L (ref 96–106)
Creatinine, Ser: 1.16 mg/dL (ref 0.76–1.27)
Glucose: 97 mg/dL (ref 70–99)
Potassium: 4.1 mmol/L (ref 3.5–5.2)
Sodium: 142 mmol/L (ref 134–144)
eGFR: 66 mL/min/1.73 (ref >59)

## 2024-04-26 LAB — T4, FREE: Free T4: 1.41 ng/dL (ref 0.82–1.77)

## 2024-04-26 LAB — TSH: TSH: 5.1 u[IU]/mL — AB (ref 0.450–4.500)

## 2024-04-26 NOTE — Telephone Encounter (Signed)
 Patient  called to say all labs were not completed yesterday - He was to have lipid panel and CMP   RN reviewed  patient CHL- chart.  Patient  had to sets of lab request listed. - last set - BMP ,TSH, T4 free  ordered in   10/ 3/25  Another set Lipid , CMP - ordered 12/30/23  Patient want to complete both sets - Patient  states he did not indicate this to lab person  only that he wanted all labs done.    RN informed patient -  1) he can come back to lab and have the missing  labs done  or  2) RN can call [facility] and see if they can be added,  RN informed patient will contact [facility]  to see if labs can be added.

## 2024-04-26 NOTE — Telephone Encounter (Signed)
 RN called [facility] copr  Added Lipid  and hepatic panel to the labs that were drawn on 10 28/29  Per [facility] Rep - if labs are not able to be obtain a letter will be sent to have patient to come in for more labs. Also a form  will be sent to Dr Francyne  for signature for add-on labs to sign and to fax back to [employer]    Patient is aware

## 2024-04-26 NOTE — Telephone Encounter (Signed)
 Pt would a c/b from Dr Francyne nurse concerning blood work please advise

## 2024-04-27 NOTE — Telephone Encounter (Signed)
 Faxed signed orders as requested.

## 2024-04-28 LAB — HEPATIC FUNCTION PANEL
ALT: 11 IU/L (ref 0–44)
AST: 20 IU/L (ref 0–40)
Albumin: 4.2 g/dL (ref 3.8–4.8)
Alkaline Phosphatase: 53 IU/L (ref 47–123)
Bilirubin Total: 0.6 mg/dL (ref 0.0–1.2)
Bilirubin, Direct: 0.18 mg/dL (ref 0.00–0.40)
Total Protein: 6.5 g/dL (ref 6.0–8.5)

## 2024-04-28 LAB — LIPID PANEL W/O CHOL/HDL RATIO
Cholesterol, Total: 175 mg/dL (ref 100–199)
HDL: 39 mg/dL — ABNORMAL LOW (ref >39)
LDL Chol Calc (NIH): 121 mg/dL — ABNORMAL HIGH (ref 0–99)
Triglycerides: 78 mg/dL (ref 0–149)
VLDL Cholesterol Cal: 15 mg/dL (ref 5–40)

## 2024-04-28 LAB — SPECIMEN STATUS REPORT

## 2024-05-01 ENCOUNTER — Other Ambulatory Visit (HOSPITAL_BASED_OUTPATIENT_CLINIC_OR_DEPARTMENT_OTHER)

## 2024-05-01 ENCOUNTER — Ambulatory Visit (HOSPITAL_COMMUNITY)
Admission: RE | Admit: 2024-05-01 | Discharge: 2024-05-01 | Disposition: A | Discharge status: Home / Self Care | Source: Ambulatory Visit | Attending: Cardiovascular Disease | Admitting: Cardiovascular Disease

## 2024-05-01 DIAGNOSIS — I700 Atherosclerosis of aorta: Secondary | ICD-10-CM | POA: Insufficient documentation

## 2024-05-01 DIAGNOSIS — I2510 Atherosclerotic heart disease of native coronary artery without angina pectoris: Secondary | ICD-10-CM | POA: Diagnosis not present

## 2024-05-01 DIAGNOSIS — I7121 Aneurysm of the ascending aorta, without rupture: Secondary | ICD-10-CM | POA: Diagnosis present

## 2024-05-01 MED ORDER — IOHEXOL 350 MG/ML SOLN
80.0000 mL | Freq: Once | INTRAVENOUS | Status: AC | PRN
  Administered 2024-05-01: 80 mL via INTRAVENOUS

## 2024-05-03 ENCOUNTER — Other Ambulatory Visit (HOSPITAL_COMMUNITY): Payer: Self-pay

## 2024-05-03 MED ORDER — TRAZODONE HCL 50 MG PO TABS
25.0000 mg | ORAL_TABLET | Freq: Every evening | ORAL | 0 refills | Status: DC | PRN
  Filled 2024-05-03: qty 30, 30d supply, fill #0

## 2024-05-22 ENCOUNTER — Other Ambulatory Visit (HOSPITAL_COMMUNITY): Payer: Self-pay

## 2024-05-22 ENCOUNTER — Ambulatory Visit: Attending: Cardiovascular Disease | Admitting: Cardiovascular Disease

## 2024-05-22 ENCOUNTER — Encounter: Payer: Self-pay | Admitting: Cardiovascular Disease

## 2024-05-22 VITALS — BP 116/58 | HR 70 | Ht 70.0 in | Wt 183.0 lb

## 2024-05-22 DIAGNOSIS — I7121 Aneurysm of the ascending aorta, without rupture: Secondary | ICD-10-CM | POA: Diagnosis not present

## 2024-05-22 DIAGNOSIS — E7800 Pure hypercholesterolemia, unspecified: Secondary | ICD-10-CM

## 2024-05-22 DIAGNOSIS — I10 Essential (primary) hypertension: Secondary | ICD-10-CM | POA: Diagnosis not present

## 2024-05-22 DIAGNOSIS — I4729 Other ventricular tachycardia: Secondary | ICD-10-CM

## 2024-05-22 DIAGNOSIS — N1831 Chronic kidney disease, stage 3a: Secondary | ICD-10-CM

## 2024-05-22 DIAGNOSIS — E039 Hypothyroidism, unspecified: Secondary | ICD-10-CM

## 2024-05-22 MED ORDER — LISINOPRIL 2.5 MG PO TABS
2.5000 mg | ORAL_TABLET | Freq: Every day | ORAL | 3 refills | Status: AC
  Filled 2024-05-22: qty 90, 90d supply, fill #0

## 2024-05-22 NOTE — Progress Notes (Signed)
 Patient ID: Derrick [name], male   DOB: 22-Aug-1950, 73 y.o.   MRN: [id number]     Cardiology Office Note    Date:  05/22/2024   ID:  Derrick [name], Derrick [name] 26-Oct-1950, MRN [id number]  PCP:  Dorcus Lamar POUR, MD  Cardiologist:   Jerel Balding, MD   No chief complaint on file.   History of Present Illness:  Derrick [name] is a 73 y.o. male with systemic hypertension, hypercholesterolemia, erectile dysfunction, here for follow-up after undergoing a follow-up CT of the chest for a dilated ascending aorta.Derrick [name]  He is doing great.  He rides his E-assist bike 80 miles a week, denies shortness of breath or chest pain.  He has remained quite lean.  His blood pressure is very well-controlled and in fact in the evenings he often sees a diastolic blood pressure in the low 50s and as low as 48.  Has not had dizziness or syncope.  Denies palpitations.  Has not had lower extremity edema, focal neurological events or claudication.  Follow-up CT angiogram shows an unchanged dimension of the ascending aorta at 4.5 cm.  Most recent creatinine performed just before that scan was 1.16.  Continues to have moderately elevated cholesterol and borderline low HDL, but prefers not to take cholesterol-lowering medications.  His calcium score was less than average for age.  In 2013, he had an echo that showed normal LV size and systolic function and normal valves. A nuclear stress test in 2005 was a normal test, but he did have brief nonsustained VT during the ECG portion of the test.  In 2013 his Holter monitor showed a single 6 beat run of nonsustained VT.   Past Medical History:  Diagnosis Date   Arthritis NECK   Cervical radiculopathy    Chest pain, atypical 2005   myoview 09/13/03-normal perfusion   ED (erectile dysfunction)    intolerant to viagra and cialis    Hematuria    History of prostatitis    Hyperlipidemia    Hypertension CARDIOLOGIST- [facility]   STRESS TEST YRS AGO-- OK  ; LAST VISIT 1 MONTH AGO    Hypothyroidism    Palpitations    event monitor 03-2012-showed a single 6 beat run of NSVT; echo 05/25/12-EF55-60%, mild LVH   S/P cervical spinal fusion    TIA (transient ischemic attack)    Vertigo 2005   nl carotid dopplers 08/09/2003    Past Surgical History:  Procedure Laterality Date   ANTERIOR CERVICAL DECOMP/DISCECTOMY FUSION  07-29-2000   C4 - C7   COLONOSCOPY N/A 01/07/2015   Procedure: COLONOSCOPY;  Surgeon: Claudis RAYMOND Rivet, MD;  [facility]: [facility];  Service: Endoscopy;  Laterality: N/A;  145   COLONOSCOPY WITH PROPOFOL  N/A 01/19/2022   Procedure: COLONOSCOPY WITH PROPOFOL ;  Surgeon: Eartha Angelia Sieving, MD;  Location: [facility];  Service: Gastroenterology;  Laterality: N/A;  925 ASA 3   CYSTOSCOPY W/ RETROGRADES  12/25/2011   Procedure: CYSTOSCOPY WITH RETROGRADE PYELOGRAM;  Surgeon: Garnette Shack, MD;  Location: [facility];  Service: Urology;  Laterality: Bilateral;   CYSTOSCOPY W/ URETEROSCOPY  20 YRS AGO  (APPROX. 1990'S)    Outpatient Medications Prior to Visit  Medication Sig Dispense Refill   ascorbic acid (VITAMIN C) 1000 MG tablet Take 1,000 mg by mouth daily.     aspirin  EC (ASPIRIN  LOW DOSE) 81 MG tablet Take 1 tablet (81 mg total) by mouth daily. Swallow whole. 90 tablet 1   bisoprolol -hydrochlorothiazide  (ZIAC ) 5-6.25 MG  tablet Take 1 tablet by mouth every evening. 90 tablet 3   bisoprolol -hydrochlorothiazide  (ZIAC ) 5-6.25 MG tablet Take 1 tablet by mouth 2 (two) times daily. 180 tablet 3   diazepam  (VALIUM ) 5 MG tablet Take 2.5 mg by mouth at bedtime.  0   diazepam  (VALIUM ) 5 MG tablet Take 1 tablet (5 mg total) by mouth every 12 (twelve) hours as needed for anxiety 60 tablet 5   Flaxseed, Linseed, (FLAXSEED OIL) 1000 MG CAPS Take 1 capsule by mouth daily at 6 (six) AM.     KRILL OIL PO Take 2 capsules by mouth daily at 6 (six) AM.     levothyroxine  (SYNTHROID ) 75 MCG tablet Take 75 mcg by mouth every morning.     levothyroxine   (SYNTHROID ) 75 MCG tablet Take 1 tablet (75 mcg total) by mouth daily. 90 tablet 3   Magnesium 500 MG CAPS Take 1 capsule by mouth at bedtime.     meclizine (ANTIVERT) 25 MG tablet Take 25 mg by mouth daily as needed.     tadalafil  (CIALIS ) 10 MG tablet Take 10 mg by mouth daily as needed.     traZODone  (DESYREL ) 50 MG tablet Take 0.5 tablets (25 mg total) by mouth at bedtime for 4 days then 1 tablet at bedtime as needed for sleep. 30 tablet 0   Zinc 30 MG TABS Take 30 mg by mouth daily at 6 (six) AM.     lisinopril  (PRINIVIL ,ZESTRIL ) 5 MG tablet Take 1 tablet (5 mg total) by mouth daily. 90 tablet 3   lisinopril  (ZESTRIL ) 5 MG tablet Take 1 tablet (5 mg total) by mouth daily. 90 tablet 3   No facility-administered medications prior to visit.     Allergies:   Patient has no known allergies.   Social History   Socioeconomic History   Marital status: Married    Spouse name: Not on file   Number of children: 2   Years of education: Not on file   Highest education level: Not on file  Occupational History   Occupation: retired  Tobacco Use   Smoking status: Never   Smokeless tobacco: Never  Vaping Use   Vaping status: Never Used  Substance and Sexual Activity   Alcohol use: No    Alcohol/week: 0.0 standard drinks of alcohol   Drug use: No   Sexual activity: Not on file  Other Topics Concern   Not on file  Social History Narrative   Patient drinks very little caffeine.   Patient is right handed.   Social Drivers of Corporate Investment Banker Strain: Low Risk  (07/26/2023)   Received from [facility]   Overall Financial Resource Strain (CARDIA)    Difficulty of Paying Living Expenses: Not hard at all  Food Insecurity: No Food Insecurity (07/26/2023)   Received from [facility]   Hunger Vital Sign    Within the past 12 months, you worried that your food would run out before you got the money to buy more.: Never true    Within the past 12 months, the food you bought just  didn't last and you didn't have money to get more.: Never true  Transportation Needs: No Transportation Needs (07/26/2023)   Received from [facility] - Transportation    Lack of Transportation (Medical): No    Lack of Transportation (Non-Medical): No  Physical Activity: Sufficiently Active (05/03/2023)   Received from [facility]   Exercise Vital Sign    On average, how many  days per week do you engage in moderate to strenuous exercise (like a brisk walk)?: 4 days    On average, how many minutes do you engage in exercise at this level?: 150+ min  Stress: No Stress Concern Present (05/03/2023)   Received from [facility] - Occupational Stress Questionnaire    Feeling of Stress : Not at all  Social Connections: Socially Integrated (05/03/2023)   Received from [facility]   Social Network    How would you rate your social network (family, work, friends)?: Good participation with social networks     Family History:  The patient's family history includes CVA in his father; Healthy in his sister, son, and son; Heart attack in his brother and mother.   ROS:   Please see the history of present illness.    ROS All other systems are reviewed and are negative.   PHYSICAL EXAM:   VS:  BP (!) 116/58 (BP Location: Left Arm, Patient Position: Sitting, Cuff Size: Normal)   Pulse 70   Ht 5' 10 (1.778 m)   Wt 183 lb (83 kg)   SpO2 98%   BMI 26.26 kg/m       General: Alert, oriented x3, no distress, appears quite fit and younger than stated age. Head: no evidence of trauma, PERRL, EOMI, no exophtalmos or lid lag, no myxedema, no xanthelasma; normal ears, nose and oropharynx Neck: normal jugular venous pulsations and no hepatojugular reflux; brisk carotid pulses without delay and no carotid bruits Chest: clear to auscultation, no signs of consolidation by percussion or palpation, normal fremitus, symmetrical and full respiratory  excursions Cardiovascular: normal position and quality of the apical impulse, regular rhythm, normal first and second heart sounds, no murmurs, rubs or gallops Abdomen: no tenderness or distention, no masses by palpation, no abnormal pulsatility or arterial bruits, normal bowel sounds, no hepatosplenomegaly Extremities: no clubbing, cyanosis or edema; 2+ radial, ulnar and brachial pulses bilaterally; 2+ right femoral, posterior tibial and dorsalis pedis pulses; 2+ left femoral, posterior tibial and dorsalis pedis pulses; no subclavian or femoral bruits Neurological: grossly nonfocal Psych: Normal mood and affect      Wt Readings from Last 3 Encounters:  05/22/24 183 lb (83 kg)  05/21/23 182 lb (82.6 kg)  09/15/22 185 lb 6.4 oz (84.1 kg)      Studies/Labs Reviewed:   EKG:   EKG Interpretation Date/Time:  Monday May 22 2024 07:59:06 EST Ventricular Rate:  70 PR Interval:  182 QRS Duration:  94 QT Interval:  384 QTC Calculation: 414 R Axis:   65  Text Interpretation: Normal sinus rhythm Minimal voltage criteria for LVH, may be normal variant ( Cornell product ) Anterior infarct , age undetermined When compared with ECG of 21-May-2023 09:51, No significant change was found Confirmed by Willadene Mounsey [phone]) on 05/22/2024 8:16:45 AM         Recent Labs: 04/25/2024: ALT 11; BUN 21; Creatinine, Ser 1.16; Potassium 4.1; Sodium 142; TSH 5.100   Lipid Panel    Component Value Date/Time   CHOL 175 04/25/2024 0811   TRIG 78 04/25/2024 0811   HDL 39 (L) 04/25/2024 0811   CHOLHDL 4.6 09/15/2022 1413   CHOLHDL 4.6 07/27/2016 1019   VLDL 17 07/27/2016 1019   LDLCALC 121 (H) 04/25/2024 0811    ASSESSMENT:    1. Aneurysm of ascending aorta without rupture   2. Hypercholesterolemia   3. Essential hypertension   4. NSVT (nonsustained ventricular tachycardia) (  HCC)   5. Acquired hypothyroidism   6. Stage 3a chronic kidney disease (HCC)      PLAN:  In order of problems  listed above:   Ascending aortic aneurysm: Asymptomatic, stable at 4.5 cm (was 4.7 cm on noncontrast CT).  Repeat CT angiogram in a year.  If it remains unchanged consider increasing the interval to every 2 years. HLP: Mildly elevated, also with borderline low LDL cholesterol, but without any evidence of CAD or PAD and with a reassuring calcium score, less than average for age and gender.  He prefers not to take medications.  This is reasonable. HTN: Controlled.  In fact his diastolic blood pressure is rather low.  Reduce the lisinopril  to 2.5 mg daily. NSVT: Detected on past monitoring devices, but always asymptomatic.  In the absence of structural heart disease unlikely to be a serious abnormality.  He is on beta-blockers. HypoT4: On levothyroxine  supplements appears euvolemic clinically and had a TSH 5.100 last month. CKD: Most recent creatinine was actually improved at 1.16 (previous baseline seemed to be around 1.3 going back the last 10 years).  He sees a nephrologist regularly.     Medication Adjustments/Labs and Tests Ordered: Current medicines are reviewed at length with the patient today.  Concerns regarding medicines are outlined above.  Medication changes, Labs and Tests ordered today are listed in the Patient Instructions below. Patient Instructions  Medication Instructions:  Decrease Lisinopril  to 2.5 mg daily *If you need a refill on your cardiac medications before your next appointment, please call your pharmacy*  Lab Work: None ordered If you have labs (blood work) drawn today and your tests are completely normal, you will receive your results only by: MyChart Message (if you have MyChart) OR A paper copy in the mail If you have any lab test that is abnormal or we need to change your treatment, we will call you to review the results.  Testing/Procedures: CTA Aorta- need this completed before your yearly follow up appointment with Dr Francyne  Follow-Up: At [facility], you and your health needs are our priority.  As part of our continuing mission to provide you with exceptional heart care, our providers are all part of one team.  This team includes your primary Cardiologist (physician) and Advanced Practice Providers or APPs (Physician Assistants and Nurse Practitioners) who all work together to provide you with the care you need, when you need it.  Your next appointment:   1 year(s)  Provider:   Jerel Francyne, MD    We recommend signing up for the patient portal called MyChart.  Sign up information is provided on this After Visit Summary.  MyChart is used to connect with patients for Virtual Visits (Telemedicine).  Patients are able to view lab/test results, encounter notes, upcoming appointments, etc.  Non-urgent messages can be sent to your provider as well.   To learn more about what you can do with MyChart, go to [email or web address].         Signed, Jerel Francyne, MD  05/22/2024 9:09 AM    [facility] [address] [place], [place], [place]  [address] Phone: [phone]; Fax: [phone]

## 2024-05-22 NOTE — Addendum Note (Signed)
 Addended by: Cloie Wooden L on: 05/22/2024 01:25 PM   Modules accepted: Orders

## 2024-05-22 NOTE — Patient Instructions (Signed)
 Medication Instructions:  Decrease Lisinopril  to 2.5 mg daily *If you need a refill on your cardiac medications before your next appointment, please call your pharmacy*  Lab Work: None ordered If you have labs (blood work) drawn today and your tests are completely normal, you will receive your results only by: MyChart Message (if you have MyChart) OR A paper copy in the mail If you have any lab test that is abnormal or we need to change your treatment, we will call you to review the results.  Testing/Procedures: CTA Aorta- need this completed before your yearly follow up appointment with Dr Francyne  Follow-Up: At [facility], you and your health needs are our priority.  As part of our continuing mission to provide you with exceptional heart care, our providers are all part of one team.  This team includes your primary Cardiologist (physician) and Advanced Practice Providers or APPs (Physician Assistants and Nurse Practitioners) who all work together to provide you with the care you need, when you need it.  Your next appointment:   1 year(s)  Provider:   Jerel Francyne, MD    We recommend signing up for the patient portal called MyChart.  Sign up information is provided on this After Visit Summary.  MyChart is used to connect with patients for Virtual Visits (Telemedicine).  Patients are able to view lab/test results, encounter notes, upcoming appointments, etc.  Non-urgent messages can be sent to your provider as well.   To learn more about what you can do with MyChart, go to [email or web address].

## 2024-05-26 ENCOUNTER — Other Ambulatory Visit (HOSPITAL_COMMUNITY): Payer: Self-pay

## 2024-05-26 MED ORDER — TRAZODONE HCL 50 MG PO TABS: 25.0000 mg | ORAL_TABLET | Freq: Every evening | ORAL | 0 refills | Status: AC | PRN

## 2024-06-08 ENCOUNTER — Telehealth: Payer: Self-pay | Admitting: Cardiovascular Disease

## 2024-06-08 NOTE — Telephone Encounter (Signed)
 Thank him for the update.  Continue medications unchanged.

## 2024-06-08 NOTE — Telephone Encounter (Signed)
 Pt calling with blood pressure logs.   12/10 last night  108/62 HR68  103/58 HR57

## 2024-06-12 ENCOUNTER — Other Ambulatory Visit (HOSPITAL_COMMUNITY): Payer: Self-pay

## 2024-06-23 ENCOUNTER — Other Ambulatory Visit: Payer: Self-pay

## 2024-06-23 ENCOUNTER — Other Ambulatory Visit (HOSPITAL_COMMUNITY): Payer: Self-pay

## 2024-06-26 ENCOUNTER — Other Ambulatory Visit (HOSPITAL_COMMUNITY): Payer: Self-pay

## 2024-06-26 ENCOUNTER — Other Ambulatory Visit: Payer: Self-pay
